# Patient Record
Sex: Female | Born: 1965
Health system: Southern US, Community
[De-identification: ages and names within clinical notes are randomized; demographics above are authoritative.]

## PROBLEM LIST (undated history)

## (undated) DIAGNOSIS — D126 Benign neoplasm of colon, unspecified: Secondary | ICD-10-CM

## (undated) DIAGNOSIS — F1010 Alcohol abuse, uncomplicated: Secondary | ICD-10-CM

## (undated) DIAGNOSIS — E785 Hyperlipidemia, unspecified: Secondary | ICD-10-CM

## (undated) DIAGNOSIS — M1990 Unspecified osteoarthritis, unspecified site: Secondary | ICD-10-CM

## (undated) DIAGNOSIS — I10 Essential (primary) hypertension: Secondary | ICD-10-CM

## (undated) DIAGNOSIS — K862 Cyst of pancreas: Secondary | ICD-10-CM

## (undated) DIAGNOSIS — M109 Gout, unspecified: Secondary | ICD-10-CM

## (undated) DIAGNOSIS — K5792 Diverticulitis of intestine, part unspecified, without perforation or abscess without bleeding: Secondary | ICD-10-CM

## (undated) HISTORY — DX: Diverticulitis of intestine, part unspecified, without perforation or abscess without bleeding: K57.92

## (undated) HISTORY — DX: Gout, unspecified: M10.9

## (undated) HISTORY — PX: TONSILLECTOMY: SUR1361

## (undated) HISTORY — DX: Hyperlipidemia, unspecified: E78.5

## (undated) HISTORY — DX: Unspecified osteoarthritis, unspecified site: M19.90

## (undated) HISTORY — DX: Alcohol abuse, uncomplicated: F10.10

## (undated) HISTORY — PX: WISDOM TOOTH EXTRACTION: SHX21

## (undated) HISTORY — DX: Cyst of pancreas: K86.2

## (undated) HISTORY — DX: Benign neoplasm of colon, unspecified: D12.6

## (undated) HISTORY — PX: MOUTH SURGERY: SHX715

---

## 2011-08-22 ENCOUNTER — Emergency Department (HOSPITAL_COMMUNITY): Payer: Worker's Compensation

## 2011-08-22 ENCOUNTER — Encounter (HOSPITAL_COMMUNITY): Payer: Self-pay

## 2011-08-22 ENCOUNTER — Emergency Department (HOSPITAL_COMMUNITY)
Admission: EM | Admit: 2011-08-22 | Discharge: 2011-08-22 | Disposition: A | Discharge status: Home / Self Care | Payer: Worker's Compensation | Attending: Emergency Medicine | Admitting: Emergency Medicine

## 2011-08-22 DIAGNOSIS — S139XXA Sprain of joints and ligaments of unspecified parts of neck, initial encounter: Secondary | ICD-10-CM | POA: Insufficient documentation

## 2011-08-22 DIAGNOSIS — R51 Headache: Secondary | ICD-10-CM | POA: Insufficient documentation

## 2011-08-22 DIAGNOSIS — S161XXA Strain of muscle, fascia and tendon at neck level, initial encounter: Secondary | ICD-10-CM

## 2011-08-22 DIAGNOSIS — M542 Cervicalgia: Secondary | ICD-10-CM | POA: Insufficient documentation

## 2011-08-22 DIAGNOSIS — I10 Essential (primary) hypertension: Secondary | ICD-10-CM | POA: Insufficient documentation

## 2011-08-22 DIAGNOSIS — S0990XA Unspecified injury of head, initial encounter: Secondary | ICD-10-CM | POA: Insufficient documentation

## 2011-08-22 DIAGNOSIS — M79609 Pain in unspecified limb: Secondary | ICD-10-CM | POA: Insufficient documentation

## 2011-08-22 HISTORY — DX: Essential (primary) hypertension: I10

## 2011-08-22 MED ORDER — HYDROCODONE-ACETAMINOPHEN 5-325 MG PO TABS: 1.0000 | ORAL_TABLET | Freq: Four times a day (QID) | ORAL | Status: AC | PRN

## 2011-08-22 MED ORDER — ONDANSETRON 4 MG PO TBDP
ORAL_TABLET | ORAL | Status: AC
  Administered 2011-08-22: 8 mg
  Filled 2011-08-22: qty 2

## 2011-08-22 MED ORDER — ONDANSETRON HCL 4 MG/2ML IJ SOLN: 4.0000 mg | Freq: Once | INTRAMUSCULAR | Status: DC

## 2011-08-22 MED ORDER — OXYCODONE-ACETAMINOPHEN 5-325 MG PO TABS
1.0000 | ORAL_TABLET | Freq: Once | ORAL | Status: AC
  Administered 2011-08-22: 1 via ORAL
  Filled 2011-08-22: qty 1

## 2011-08-22 MED ORDER — NAPROXEN 500 MG PO TABS: 500.0000 mg | ORAL_TABLET | Freq: Two times a day (BID) | ORAL | Status: AC

## 2011-08-22 MED ORDER — ONDANSETRON HCL 8 MG PO TABS: 8.0000 mg | ORAL_TABLET | Freq: Once | ORAL | Status: DC

## 2011-08-22 NOTE — ED Notes (Signed)
Pt cleared from back board per MD 

## 2011-08-22 NOTE — ED Notes (Signed)
Pt [employer]  [phone]

## 2011-08-22 NOTE — ED Notes (Signed)
Patient undressed after taken off LSB and on blood pressure cuff and pulse ox.

## 2011-08-22 NOTE — ED Notes (Signed)
Patient requesting something for pain. RN notified 
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
## 2011-08-22 NOTE — ED Notes (Signed)
MD at bedside. 
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
## 2012-01-20 ENCOUNTER — Other Ambulatory Visit: Payer: Self-pay | Admitting: Emergency Medicine

## 2012-01-20 NOTE — Telephone Encounter (Signed)
Need chart

## 2012-10-09 ENCOUNTER — Ambulatory Visit: Payer: Self-pay | Admitting: Emergency Medicine

## 2012-10-09 VITALS — BP 118/74 | HR 80 | Temp 98.4°F | Resp 18 | Ht 60.0 in | Wt 154.2 lb

## 2012-10-09 DIAGNOSIS — M47812 Spondylosis without myelopathy or radiculopathy, cervical region: Secondary | ICD-10-CM | POA: Insufficient documentation

## 2012-10-09 DIAGNOSIS — J301 Allergic rhinitis due to pollen: Secondary | ICD-10-CM

## 2012-10-09 DIAGNOSIS — J309 Allergic rhinitis, unspecified: Secondary | ICD-10-CM

## 2012-10-09 DIAGNOSIS — I10 Essential (primary) hypertension: Secondary | ICD-10-CM

## 2012-10-09 DIAGNOSIS — M542 Cervicalgia: Secondary | ICD-10-CM

## 2012-10-09 DIAGNOSIS — F1020 Alcohol dependence, uncomplicated: Secondary | ICD-10-CM | POA: Insufficient documentation

## 2012-10-09 LAB — CBC
Hemoglobin: 13.1 g/dL (ref 12.0–15.0)
MCH: 29.3 pg (ref 26.0–34.0)
Platelets: 371 10*3/uL (ref 150–400)
RBC: 4.47 MIL/uL (ref 3.87–5.11)
WBC: 12 10*3/uL — ABNORMAL HIGH (ref 4.0–10.5)

## 2012-10-09 LAB — COMPREHENSIVE METABOLIC PANEL
ALT: 12 U/L (ref 0–35)
Albumin: 4.6 g/dL (ref 3.5–5.2)
CO2: 26 mEq/L (ref 19–32)
Chloride: 102 mEq/L (ref 96–112)
Glucose, Bld: 86 mg/dL (ref 70–99)
Potassium: 4.5 mEq/L (ref 3.5–5.3)
Sodium: 138 mEq/L (ref 135–145)
Total Protein: 7.3 g/dL (ref 6.0–8.3)

## 2012-10-09 MED ORDER — LISINOPRIL-HYDROCHLOROTHIAZIDE 10-12.5 MG PO TABS: ORAL_TABLET | ORAL | Status: DC

## 2012-10-09 MED ORDER — CYCLOBENZAPRINE HCL 10 MG PO TABS: ORAL_TABLET | ORAL | Status: DC

## 2012-10-09 MED ORDER — FLUTICASONE PROPIONATE 50 MCG/ACT NA SUSP: 2.0000 | Freq: Every day | NASAL | Status: DC
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
## 2012-10-22 ENCOUNTER — Encounter: Payer: Self-pay | Admitting: Emergency Medicine

## 2013-04-20 ENCOUNTER — Ambulatory Visit: Payer: Self-pay | Admitting: Emergency Medicine

## 2013-04-20 VITALS — BP 108/78 | HR 91 | Temp 97.9°F | Resp 18 | Ht 59.75 in | Wt 140.8 lb

## 2013-04-20 DIAGNOSIS — R1032 Left lower quadrant pain: Secondary | ICD-10-CM

## 2013-04-20 DIAGNOSIS — R509 Fever, unspecified: Secondary | ICD-10-CM

## 2013-04-20 DIAGNOSIS — F17200 Nicotine dependence, unspecified, uncomplicated: Secondary | ICD-10-CM | POA: Insufficient documentation

## 2013-04-20 LAB — POCT CBC
Granulocyte percent: 77.7 %G (ref 37–80)
Hemoglobin: 13.9 g/dL (ref 12.2–16.2)
MCH, POC: 30.6 pg (ref 27–31.2)
MCV: 95.2 fL (ref 80–97)
MID (cbc): 0.5 (ref 0–0.9)
MPV: 8.9 fL (ref 0–99.8)
Platelet Count, POC: 304 10*3/uL (ref 142–424)
RBC: 4.54 M/uL (ref 4.04–5.48)
WBC: 8.2 10*3/uL (ref 4.6–10.2)

## 2013-04-20 LAB — COMPREHENSIVE METABOLIC PANEL
ALT: 20 U/L (ref 0–35)
AST: 24 U/L (ref 0–37)
Albumin: 4.3 g/dL (ref 3.5–5.2)
BUN: 4 mg/dL — ABNORMAL LOW (ref 6–23)
CO2: 28 mEq/L (ref 19–32)
Calcium: 9.6 mg/dL (ref 8.4–10.5)
Chloride: 101 mEq/L (ref 96–112)
Creat: 0.54 mg/dL (ref 0.50–1.10)
Potassium: 3.2 mEq/L — ABNORMAL LOW (ref 3.5–5.3)

## 2013-04-20 LAB — IFOBT (OCCULT BLOOD): IFOBT: NEGATIVE

## 2013-04-20 LAB — POCT URINALYSIS DIPSTICK
Leukocytes, UA: NEGATIVE
Nitrite, UA: NEGATIVE
Urobilinogen, UA: 0.2
pH, UA: 6

## 2013-04-20 LAB — POCT UA - MICROSCOPIC ONLY
Casts, Ur, LPF, POC: NEGATIVE
Crystals, Ur, HPF, POC: NEGATIVE
Yeast, UA: NEGATIVE

## 2013-04-20 MED ORDER — CIPROFLOXACIN HCL 500 MG PO TABS: 500.0000 mg | ORAL_TABLET | Freq: Two times a day (BID) | ORAL | Status: DC

## 2013-04-20 NOTE — Patient Instructions (Addendum)
Diverticulitis  A diverticulum is a small pouch or sac on the colon. Diverticulosis is the presence of these diverticula on the colon. Diverticulitis is the irritation (inflammation) or infection of diverticula.  CAUSES   The colon and its diverticula contain bacteria. If food particles block the tiny opening to a diverticulum, the bacteria inside can grow and cause an increase in pressure. This leads to infection and inflammation and is called diverticulitis.  SYMPTOMS    Abdominal pain and tenderness. Usually, the pain is located on the left side of your abdomen. However, it could be located elsewhere.   Fever.   Bloating.   Feeling sick to your stomach (nausea).   Throwing up (vomiting).   Abnormal stools.  DIAGNOSIS   Your caregiver will take a history and perform a physical exam. Since many things can cause abdominal pain, other tests may be necessary. Tests may include:   Blood tests.   Urine tests.   X-ray of the abdomen.   CT scan of the abdomen.  Sometimes, surgery is needed to determine if diverticulitis or other conditions are causing your symptoms.  TREATMENT   Most of the time, you can be treated without surgery. Treatment includes:   Resting the bowels by only having liquids for a few days. As you improve, you will need to eat a low-fiber diet.   Intravenous (IV) fluids if you are losing body fluids (dehydrated).   Antibiotic medicines that treat infections may be given.   Pain and nausea medicine, if needed.   Surgery if the inflamed diverticulum has burst.  HOME CARE INSTRUCTIONS    Try a clear liquid diet (broth, tea, or water for as long as directed by your caregiver). You may then gradually begin a low-fiber diet as tolerated.   A low-fiber diet is a diet with less than 10 grams of fiber. Choose the foods below to reduce fiber in the diet:   White breads, cereals, rice, and pasta.   Cooked fruits and vegetables or soft fresh fruits and vegetables without the skin.   Ground or  well-cooked tender beef, ham, veal, lamb, pork, or poultry.   Eggs and seafood.   After your diverticulitis symptoms have improved, your caregiver may put you on a high-fiber diet. A high-fiber diet includes 14 grams of fiber for every 1000 calories consumed. For a standard 2000 calorie diet, you would need 28 grams of fiber. Follow these diet guidelines to help you increase the fiber in your diet. It is important to slowly increase the amount fiber in your diet to avoid gas, constipation, and bloating.   Choose whole-grain breads, cereals, pasta, and brown rice.   Choose fresh fruits and vegetables with the skin on. Do not overcook vegetables because the more vegetables are cooked, the more fiber is lost.   Choose more nuts, seeds, legumes, dried peas, beans, and lentils.   Look for food products that have greater than 3 grams of fiber per serving on the Nutrition Facts label.   Take all medicine as directed by your caregiver.   If your caregiver has given you a follow-up appointment, it is very important that you go. Not going could result in lasting (chronic) or permanent injury, pain, and disability. If there is any problem keeping the appointment, call to reschedule.  SEEK MEDICAL CARE IF:    Your pain does not improve.   You have a hard time advancing your diet beyond clear liquids.   Your bowel movements do not return   to normal.  SEEK IMMEDIATE MEDICAL CARE IF:    Your pain becomes worse.   You have an oral temperature above 102 F (38.9 C), not controlled by medicine.   You have repeated vomiting.   You have bloody or black, tarry stools.   Symptoms that brought you to your caregiver become worse or are not getting better.  MAKE SURE YOU:    Understand these instructions.   Will watch your condition.   Will get help right away if you are not doing well or get worse.  Document Released: 04/11/2005 Document Revised: 09/24/2011 Document Reviewed: 08/07/2010  [employer] Patient Information  2014 [employer]  Hematuria, Adult  Hematuria (blood in your urine) can be caused by a bladder infection (cystitis), kidney infection (pyelonephritis), prostate infection (prostatitis), or kidney stone. Infections will usually respond to antibiotics (medications which kill germs), and a kidney stone will usually pass through your urine without further treatment. If you were put on antibiotics, take all the medicine until gone. You may feel better in a few days, but take all of your medicine or the infection may not respond and become more difficult to treat. If antibiotics were not given, an infection did not cause the blood in the urine. A further work up to find out the reason may be needed.  HOME CARE INSTRUCTIONS    Drink lots of fluid, 3 to 4 quarts a day. If you have been diagnosed with an infection, cranberry juice is especially recommended, in addition to large amounts of water.   Avoid caffeine, tea, and carbonated beverages, because they tend to irritate the bladder.   Avoid alcohol as it may irritate the prostate.   Only take over-the-counter or prescription medicines for pain, discomfort, or fever as directed by your caregiver.   If you have been diagnosed with a kidney stone follow your caregivers instructions regarding straining your urine to catch the stone.  TO PREVENT FURTHER INFECTIONS:   Empty the bladder often. Avoid holding urine for long periods of time.   After a bowel movement, women should cleanse front to back. Use each tissue only once.   Empty the bladder before and after sexual intercourse if you are a female.   Return to your caregiver if you develop back pain, fever, nausea (feeling sick to your stomach), vomiting, or your symptoms (problems) are not better in 3 days. Return sooner if you are getting worse.  If you have been requested to return for further testing make sure to keep your appointments. If an infection is not the cause of blood in your urine, X-rays may be  required. Your caregiver will discuss this with you.  SEEK IMMEDIATE MEDICAL CARE IF:    You have a persistent fever over 102 F (38.9 C).   You develop severe vomiting and are unable to keep the medication down.   You develop severe back or abdominal pain despite taking your medications.   You begin passing a large amount of blood or clots in your urine.   You feel extremely weak or faint, or pass out.  MAKE SURE YOU:    Understand these instructions.   Will watch your condition.   Will get help right away if you are not doing well or get worse.  Document Released: 07/02/2005 Document Revised: 09/24/2011 Document Reviewed: 02/19/2008  [employer] Patient Information 2014 [employer]

## 2013-04-20 NOTE — Progress Notes (Addendum)
Subjective:    Patient ID: Ruth Perez, female    DOB: Apr 11, 1966, 47 y.o.   MRN: [id number]  Diarrhea  Associated symptoms include abdominal pain (cramping). Pertinent negatives include no chills, coughing, fever or vomiting.  Fever  Associated symptoms include abdominal pain (cramping) and diarrhea. Pertinent negatives include no congestion, coughing, nausea or vomiting.  Abdominal Pain Associated symptoms include diarrhea. Pertinent negatives include no fever, nausea or vomiting.   HPI Comments: Ruth Perez is a 47 y.o. female who presents to the Emergency Department complaining of constant, gradually worsening diarrhea and fever (max 103) which began 2 days ago. She states abdominal cramping which caused her to leave work on Friday and since onset of her sx has been drinking both water and Gatorade and eating chicken noodle soup. She has been taking ibuprofen and tylenol with temporary relief from fever. She states every few hours has an episode of diarrhea. She states green colored stool and mucous consistency. She denies cough, congestion. She denies previous similar episodes. She states fever improved yesterday but is still having frequent bouts of diarrhea.    Past Medical History  Diagnosis Date  . Hypertension    Current Outpatient Prescriptions on File Prior to Visit  Medication Sig Dispense Refill  . lisinopril-hydrochlorothiazide (PRINZIDE,ZESTORETIC) 10-12.5 MG per tablet TAKE ONE TABLET BY MOUTH EVERY DAY FOR BLOOD PRESSURE  30 tablet  11  . cyclobenzaprine (FLEXERIL) 10 MG tablet Take a half tablet during the day if needed and one tablet at night.  30 tablet  5  . fluticasone (FLONASE) 50 MCG/ACT nasal spray Place 2 sprays into the nose daily.  16 g  6   No current facility-administered medications on file prior to visit.     Review of Systems  Constitutional: Negative for fever and chills.  HENT: Negative for congestion and rhinorrhea.   Respiratory:  Negative for cough and shortness of breath.   Gastrointestinal: Positive for abdominal pain (cramping) and diarrhea. Negative for nausea and vomiting.  Neurological: Negative for weakness.  All other systems reviewed and are negative.       Objective:   Physical Exam  Nursing note and vitals reviewed. Constitutional: She is oriented to person, place, and time. She appears well-developed and well-nourished. No distress.  HENT:  Head: Normocephalic and atraumatic.  Eyes: EOM are normal.  Neck: No tracheal deviation present.  Cardiovascular: Normal rate.   Pulmonary/Chest: Effort normal. No respiratory distress. She has wheezes (rhochi and wheezes on right).  Abdominal: Soft. Bowel sounds are normal. She exhibits no distension. There is tenderness (LLQ abdominal tenderness).  Musculoskeletal: Normal range of motion.  Neurological: She is alert and oriented to person, place, and time.  Skin: Skin is warm and dry.  Psychiatric: She has a normal mood and affect. Her behavior is normal.   Results for orders placed in visit on 04/20/13  IFOBT (OCCULT BLOOD)      Result Value Range   IFOBT Negative    POCT CBC      Result Value Range   WBC 8.2  4.6 - 10.2 K/uL   Lymph, poc 1.3  0.6 - 3.4   POC LYMPH PERCENT 16.2  10 - 50 %L   MID (cbc) 0.5  0 - 0.9   POC MID % 6.1  0 - 12 %M   POC Granulocyte 6.4  2 - 6.9   Granulocyte percent 77.7  37 - 80 %G   RBC 4.54  4.04 - 5.48 M/uL  Hemoglobin 13.9  12.2 - 16.2 g/dL   HCT, POC 13.2  44.0 - 47.9 %   MCV 95.2  80 - 97 fL   MCH, POC 30.6  27 - 31.2 pg   MCHC 32.2  31.8 - 35.4 g/dL   RDW, POC 10.2     Platelet Count, POC 304  142 - 424 K/uL   MPV 8.9  0 - 99.8 fL  POCT URINALYSIS DIPSTICK      Result Value Range   Color, UA yellow     Clarity, UA clear     Glucose, UA neg     Bilirubin, UA neg     Ketones, UA neg     Spec Grav, UA 1.020     Blood, UA large     pH, UA 6.0     Protein, UA 30     Urobilinogen, UA 0.2     Nitrite, UA  neg     Leukocytes, UA Negative    POCT UA - MICROSCOPIC ONLY      Result Value Range   WBC, Ur, HPF, POC neg     RBC, urine, microscopic 10-15     Bacteria, U Microscopic trace     Mucus, UA positive     Epithelial cells, urine per micros 2-5     Crystals, Ur, HPF, POC neg     Casts, Ur, LPF, POC neg     Yeast, UA neg           Assessment & Plan:  She does have significant tenderness in her left lower quadrant. With her fever mucousy stools I suspect diverticulitis would be most likely. Patient will use a strainer because of the hematuria. I placed her on Cipro for 5 days. She is to see me on Thursday reevaluation at that time. I did not do a CT abdomen and pelvis are make a urological referral at the present time .

## 2013-04-21 ENCOUNTER — Other Ambulatory Visit: Payer: Self-pay

## 2013-04-21 MED ORDER — POTASSIUM CHLORIDE CRYS ER 20 MEQ PO TBCR: 20.0000 meq | EXTENDED_RELEASE_TABLET | Freq: Every day | ORAL | Status: DC

## 2013-04-23 ENCOUNTER — Ambulatory Visit: Payer: Self-pay | Admitting: Emergency Medicine

## 2013-04-23 VITALS — BP 110/76 | HR 74 | Temp 98.0°F | Resp 18 | Ht 59.75 in | Wt 142.2 lb

## 2013-04-23 DIAGNOSIS — R109 Unspecified abdominal pain: Secondary | ICD-10-CM

## 2013-04-23 DIAGNOSIS — N898 Other specified noninflammatory disorders of vagina: Secondary | ICD-10-CM

## 2013-04-23 DIAGNOSIS — R319 Hematuria, unspecified: Secondary | ICD-10-CM

## 2013-04-23 LAB — POCT UA - MICROSCOPIC ONLY
WBC, Ur, HPF, POC: NEGATIVE
Yeast, UA: NEGATIVE

## 2013-04-23 LAB — POCT URINALYSIS DIPSTICK
Glucose, UA: NEGATIVE
Leukocytes, UA: NEGATIVE
Nitrite, UA: NEGATIVE
Protein, UA: NEGATIVE
Urobilinogen, UA: 0.2

## 2013-04-23 LAB — POCT WET PREP WITH KOH
KOH Prep POC: NEGATIVE
Trichomonas, UA: NEGATIVE
Yeast Wet Prep HPF POC: NEGATIVE
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
## 2013-10-31 ENCOUNTER — Other Ambulatory Visit: Payer: Self-pay | Admitting: Emergency Medicine

## 2013-10-31 NOTE — Telephone Encounter (Signed)
Needs office visit.

## 2013-11-29 ENCOUNTER — Other Ambulatory Visit: Payer: Self-pay | Admitting: Physician Assistant

## 2013-12-14 ENCOUNTER — Telehealth: Payer: Self-pay

## 2013-12-14 MED ORDER — LISINOPRIL-HYDROCHLOROTHIAZIDE 10-12.5 MG PO TABS: 1.0000 | ORAL_TABLET | Freq: Every day | ORAL | Status: DC

## 2013-12-14 NOTE — Telephone Encounter (Signed)
Pt called and would like to know if she can have her script for Lisinopril called into [facility].. She can be reached at [phone] or [phone]. Thank you

## 2013-12-14 NOTE — Telephone Encounter (Signed)
Pt just got insurance. Wants to know if we can send in 30 more tablets to give her time to see Dr. Everlene Farrier. Sent. Pt aware that no more will be RF'ed after this  Scheduling. Can we see if Dr. Everlene Farrier has any openings in June for an OV? If not can you please call and let pt know so she can just walk in. Thanks

## 2013-12-15 NOTE — Telephone Encounter (Signed)
LMVM to CB to schedule an appt with Dr. Everlene Farrier.

## 2014-01-25 ENCOUNTER — Telehealth: Payer: Self-pay

## 2014-01-25 NOTE — Telephone Encounter (Signed)
Pt called and states she needs a refill on her Lisinopril. She will be making an appt. She can be reached @ [phone]. Thank you

## 2014-01-26 MED ORDER — LISINOPRIL-HYDROCHLOROTHIAZIDE 10-12.5 MG PO TABS: 1.0000 | ORAL_TABLET | Freq: Every day | ORAL | Status: DC

## 2014-01-26 NOTE — Telephone Encounter (Signed)
Sent in a 30 day supply of lisinopril for pt- called pt and lm- last time we will be able to fill request until she makes an appt. Or RTC

## 2014-02-26 ENCOUNTER — Ambulatory Visit: Payer: Self-pay | Admitting: Emergency Medicine

## 2014-03-01 ENCOUNTER — Ambulatory Visit (INDEPENDENT_AMBULATORY_CARE_PROVIDER_SITE_OTHER): Payer: BC Managed Care – PPO

## 2014-03-01 ENCOUNTER — Ambulatory Visit (INDEPENDENT_AMBULATORY_CARE_PROVIDER_SITE_OTHER): Payer: BC Managed Care – PPO | Admitting: Emergency Medicine

## 2014-03-01 ENCOUNTER — Encounter: Payer: Self-pay | Admitting: Emergency Medicine

## 2014-03-01 VITALS — BP 148/80 | HR 98 | Temp 98.0°F | Resp 16 | Wt 136.0 lb

## 2014-03-01 DIAGNOSIS — L293 Anogenital pruritus, unspecified: Secondary | ICD-10-CM

## 2014-03-01 DIAGNOSIS — I10 Essential (primary) hypertension: Secondary | ICD-10-CM

## 2014-03-01 DIAGNOSIS — N898 Other specified noninflammatory disorders of vagina: Secondary | ICD-10-CM

## 2014-03-01 DIAGNOSIS — R61 Generalized hyperhidrosis: Secondary | ICD-10-CM

## 2014-03-01 DIAGNOSIS — N951 Menopausal and female climacteric states: Secondary | ICD-10-CM

## 2014-03-01 DIAGNOSIS — F17200 Nicotine dependence, unspecified, uncomplicated: Secondary | ICD-10-CM

## 2014-03-01 DIAGNOSIS — R319 Hematuria, unspecified: Secondary | ICD-10-CM | POA: Insufficient documentation

## 2014-03-01 DIAGNOSIS — Z1211 Encounter for screening for malignant neoplasm of colon: Secondary | ICD-10-CM

## 2014-03-01 DIAGNOSIS — Z780 Asymptomatic menopausal state: Secondary | ICD-10-CM

## 2014-03-01 LAB — POCT URINALYSIS DIPSTICK
Bilirubin, UA: NEGATIVE
Glucose, UA: NEGATIVE
KETONES UA: NEGATIVE
LEUKOCYTES UA: NEGATIVE
NITRITE UA: NEGATIVE
PH UA: 5.5
PROTEIN UA: NEGATIVE
Spec Grav, UA: 1.02
Urobilinogen, UA: 0.2

## 2014-03-01 LAB — CBC WITH DIFFERENTIAL/PLATELET
BASOS PCT: 0 % (ref 0–1)
Basophils Absolute: 0 10*3/uL (ref 0.0–0.1)
Eosinophils Absolute: 0.2 10*3/uL (ref 0.0–0.7)
Eosinophils Relative: 2 % (ref 0–5)
HEMATOCRIT: 41.5 % (ref 36.0–46.0)
HEMOGLOBIN: 14 g/dL (ref 12.0–15.0)
LYMPHS ABS: 2.1 10*3/uL (ref 0.7–4.0)
LYMPHS PCT: 21 % (ref 12–46)
MCH: 31.5 pg (ref 26.0–34.0)
MCHC: 33.7 g/dL (ref 30.0–36.0)
MCV: 93.3 fL (ref 78.0–100.0)
MONO ABS: 0.7 10*3/uL (ref 0.1–1.0)
MONOS PCT: 7 % (ref 3–12)
NEUTROS ABS: 6.9 10*3/uL (ref 1.7–7.7)
NEUTROS PCT: 70 % (ref 43–77)
Platelets: 348 10*3/uL (ref 150–400)
RBC: 4.45 MIL/uL (ref 3.87–5.11)
RDW: 14.2 % (ref 11.5–15.5)
WBC: 9.9 10*3/uL (ref 4.0–10.5)

## 2014-03-01 LAB — COMPLETE METABOLIC PANEL WITH GFR
ALBUMIN: 4.5 g/dL (ref 3.5–5.2)
ALK PHOS: 51 U/L (ref 39–117)
ALT: 27 U/L (ref 0–35)
AST: 32 U/L (ref 0–37)
BUN: 16 mg/dL (ref 6–23)
CALCIUM: 9.2 mg/dL (ref 8.4–10.5)
CHLORIDE: 105 meq/L (ref 96–112)
CO2: 25 meq/L (ref 19–32)
Creat: 0.71 mg/dL (ref 0.50–1.10)
GFR, Est African American: >89 mL/min
GLUCOSE: 93 mg/dL (ref 70–99)
POTASSIUM: 4.8 meq/L (ref 3.5–5.3)
SODIUM: 140 meq/L (ref 135–145)
TOTAL PROTEIN: 7.3 g/dL (ref 6.0–8.3)
Total Bilirubin: 0.3 mg/dL (ref 0.2–1.2)

## 2014-03-01 LAB — POCT WET PREP WITH KOH
KOH PREP POC: NEGATIVE
TRICHOMONAS UA: NEGATIVE
WBC WET PREP PER HPF POC: NEGATIVE
YEAST WET PREP PER HPF POC: NEGATIVE

## 2014-03-01 LAB — LIPID PANEL
CHOL/HDL RATIO: 3.4 ratio
CHOLESTEROL: 238 mg/dL — AB (ref 0–200)
HDL: 71 mg/dL (ref >39)
LDL Cholesterol: 109 mg/dL — ABNORMAL HIGH (ref 0–99)
Triglycerides: 290 mg/dL — ABNORMAL HIGH (ref <150)
VLDL: 58 mg/dL — ABNORMAL HIGH (ref 0–40)

## 2014-03-01 LAB — IFOBT (OCCULT BLOOD): IMMUNOLOGICAL FECAL OCCULT BLOOD TEST: NEGATIVE

## 2014-03-01 LAB — POCT UA - MICROSCOPIC ONLY
CRYSTALS, UR, HPF, POC: NEGATIVE
Casts, Ur, LPF, POC: NEGATIVE
Mucus, UA: NEGATIVE
YEAST UA: NEGATIVE

## 2014-03-01 MED ORDER — LISINOPRIL-HYDROCHLOROTHIAZIDE 10-12.5 MG PO TABS: 1.0000 | ORAL_TABLET | Freq: Every day | ORAL | Status: DC

## 2014-03-01 MED ORDER — CLOTRIMAZOLE-BETAMETHASONE 1-0.05 % EX CREA: 1.0000 "application " | TOPICAL_CREAM | Freq: Two times a day (BID) | CUTANEOUS | Status: DC

## 2014-03-01 NOTE — Progress Notes (Signed)
Subjective:    Patient ID: IVI GRIFFITH, female    DOB: 01-03-1966, 48 y.o.   MRN: [id number]  HPI  Patient here doing all right.  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  Smoking a pack a day and drinking six or seven per day.  Concerned about lab work last time she was here.  Blood in urine last time.  Potassium pills flares up her hemorrhoids.  Menopause is causing her sweats to be worse but now it is making her feel sick.  It is getting worse.  Has not had a period since last September.  Has never seen blood in her urine.  Still having vaginal itching and pain in the lower left side.  Patient is not sexually active.      Review of Systems     Objective:   Physical Exam patient is alert and cooperative. She does have excessive hair. Neck is supple without adenopathy chest is clear breasts without masses heart regular rate no murmurs abdomen is flat liver spleen not enlarged GU 7 normal female cervix is visualized and Pap smear done. The uterus is retroflexed. Rectal reveals no masses. There is some redness of the vaginal vault  [facility] reading (PRIMARY) by  Dr. Everlene Farrier on the PA view there is some mild prominence of the right infrahilar area however on the lateral view no abnormalities are seen there are no definite abnormalities on these films.  Results for orders placed in visit on 03/01/14  POCT UA - MICROSCOPIC ONLY      Result Value Ref Range   WBC, Ur, HPF, POC 0-1     RBC, urine, microscopic 4-7     Bacteria, U Microscopic 1+     Mucus, UA neg     Epithelial cells, urine per micros 1-2     Crystals, Ur, HPF, POC neg     Casts, Ur, LPF, POC neg     Yeast, UA neg    POCT URINALYSIS DIPSTICK      Result Value Ref Range   Color, UA yellow     Clarity, UA clear     Glucose, UA neg     Bilirubin, UA neg     Ketones, UA neg     Spec Grav, UA 1.020     Blood, UA mod     pH, UA 5.5     Protein, UA neg     Urobilinogen, UA 0.2     Nitrite, UA neg       Leukocytes, UA Negative    POCT WET PREP WITH KOH      Result Value Ref Range   Trichomonas, UA Negative     Clue Cells Wet Prep HPF POC 0-1     Epithelial Wet Prep HPF POC 0-1     Yeast Wet Prep HPF POC neg     Bacteria Wet Prep HPF POC 1+     RBC Wet Prep HPF POC 6-8     WBC Wet Prep HPF POC neg     KOH Prep POC Negative    IFOBT (OCCULT BLOOD)      Result Value Ref Range   IFOBT Negative     Meds ordered this encounter  Medications  . lisinopril-hydrochlorothiazide (PRINZIDE,ZESTORETIC) 10-12.5 MG per tablet    Sig: Take 1 tablet by mouth daily.    Dispense:  90 tablet    Refill:  3       Assessment &  Plan:  Patient has hematuria is a heavy smoker at risk for bladder cancer referral made to urology further evaluation. Referral made for colonoscopy. Lotrisone cream for vaginal itching. Pressure medication refilled routine labs were done. Patient is not a candidate for hormonal therapy for menopausal symptoms.

## 2014-03-02 LAB — PAP IG W/ RFLX HPV ASCU

## 2014-05-03 ENCOUNTER — Encounter: Payer: Self-pay | Admitting: Gastroenterology

## 2014-05-03 ENCOUNTER — Ambulatory Visit (INDEPENDENT_AMBULATORY_CARE_PROVIDER_SITE_OTHER): Payer: BC Managed Care – PPO | Admitting: Gastroenterology

## 2014-05-03 VITALS — BP 120/70 | HR 84 | Ht 60.0 in | Wt 141.0 lb

## 2014-05-03 DIAGNOSIS — R197 Diarrhea, unspecified: Secondary | ICD-10-CM

## 2014-05-03 DIAGNOSIS — R194 Change in bowel habit: Secondary | ICD-10-CM

## 2014-05-03 MED ORDER — MOVIPREP 100 G PO SOLR: 1.0000 | Freq: Once | ORAL | Status: DC

## 2014-05-03 NOTE — Patient Instructions (Addendum)
One of your biggest health concerns is your smoking.  This increases your risk for most cancers and serious cardiovascular diseases such as strokes, heart attacks.  You should try your best to stop.  If you need assistance, please contact your PCP or Smoking Cessation Class at [facility] [phone]) or [facility] (1-800-QUIT-NOW). You will be set up for a colonoscopy changes in bowels, diarrhea. You probably drink too much alcohol and should try to cut back as best that you can or stop altogether.

## 2014-05-03 NOTE — Progress Notes (Signed)
HPI: This is a   very pleasant 48 year old woman whom I am meeting for the first time today.  A long time ago, 10 years ago she had "some stomach stuff" and she had barium enema and that showed diverticulosis.  She had fever, diarrhea a year ago.  Was put on antibiotics.  Alcohol abuser, overuser.   Smokes 1ppd  Change in her bowels for past few months. Real loose stools.  Indigestion. NEver sees blood in her stool.  Overall her weight has been increasing.  Intermttent diarrrhea, even nocturnal.  Has never had colonoscopy  Drinks beer, usually 6-8 per day.1-2 cases of beer in a week.  Intermittent constipation.    Review of systems: Pertinent positive and negative review of systems were noted in the above HPI section. Complete review of systems was performed and was otherwise normal.    Past Medical History  Diagnosis Date  . Hypertension   . Hyperlipidemia   . Arthritis   . Alcohol abuse     Past Surgical History  Procedure Laterality Date  . Tonsillectomy    . Mouth surgery      Current Outpatient Prescriptions  Medication Sig Dispense Refill  . clotrimazole-betamethasone (LOTRISONE) cream Apply 1 application topically 2 (two) times daily.  45 g  1  . lisinopril-hydrochlorothiazide (PRINZIDE,ZESTORETIC) 10-12.5 MG per tablet Take 1 tablet by mouth daily.  90 tablet  3  . cyclobenzaprine (FLEXERIL) 10 MG tablet Take a half tablet during the day if needed and one tablet at night.  30 tablet  5  . fluticasone (FLONASE) 50 MCG/ACT nasal spray Place 2 sprays into the nose daily.  16 g  6   No current facility-administered medications for this visit.    Allergies as of 05/03/2014 - Review Complete 05/03/2014  Allergen Reaction Noted  . Codeine Nausea And Vomiting 08/22/2011    Family History  Problem Relation Age of Onset  . Heart disease Mother   . Kidney disease Father   . Colon polyps Father   . Heart disease Father   . Aneurysm Father     AAA  .  Neuropathy Father   . Thyroid cancer Mother     History   Social History  . Marital Status: Unknown    Spouse Name: N/A    Number of Children: 0  . Years of Education: N/A   Occupational History  . Not on file.   Social History Main Topics  . Smoking status: Current Every Day Smoker -- 1.00 packs/day for 35 years    Types: Cigarettes  . Smokeless tobacco: Never Used  . Alcohol Use: 24.5 oz/week    49 drink(s) per week  . Drug Use: Yes    Special: Marijuana  . Sexual Activity: Not on file   Other Topics Concern  . Not on file   Social History Narrative  . No narrative on file       Physical Exam: BP 120/70  Pulse 84  Ht 5' (1.524 m)  Wt 141 lb (63.957 kg)  BMI 27.54 kg/m2 Constitutional: generally well-appearing Psychiatric: alert and oriented x3 Eyes: extraocular movements intact Mouth: oral pharynx moist, no lesions Neck: supple no lymphadenopathy Cardiovascular: heart regular rate and rhythm Lungs: clear to auscultation bilaterally Abdomen: soft, nontender, nondistended, no obvious ascites, no peritoneal signs, normal bowel sounds Extremities: no lower extremity edema bilaterally Skin: no lesions on visible extremities    Assessment and plan: 48 y.o. female with  likely alcohol is on, change in her  bowel habits, chronically loose stools, lower pole discomforts  I explained to her that 6-8 beers per day can definitely contribute to bowel abnormalities and I recommended she try cut back or quit altogether as best as she can. She smokes cigarettes and recommended she stop that as well she can. She has never had colon cancer screening and we'll proceed with colonoscopy for her change in her bowel habits.

## 2014-05-10 ENCOUNTER — Encounter: Payer: Self-pay | Admitting: Gastroenterology

## 2014-06-15 HISTORY — PX: COLONOSCOPY: SHX174

## 2014-06-18 ENCOUNTER — Ambulatory Visit (AMBULATORY_SURGERY_CENTER): Payer: BC Managed Care – PPO | Admitting: Gastroenterology

## 2014-06-18 ENCOUNTER — Encounter: Payer: Self-pay | Admitting: Gastroenterology

## 2014-06-18 VITALS — BP 125/68 | HR 67 | Temp 98.4°F | Resp 18 | Ht 60.0 in | Wt 141.0 lb

## 2014-06-18 DIAGNOSIS — D125 Benign neoplasm of sigmoid colon: Secondary | ICD-10-CM

## 2014-06-18 DIAGNOSIS — D122 Benign neoplasm of ascending colon: Secondary | ICD-10-CM

## 2014-06-18 DIAGNOSIS — R194 Change in bowel habit: Secondary | ICD-10-CM

## 2014-06-18 DIAGNOSIS — D128 Benign neoplasm of rectum: Secondary | ICD-10-CM

## 2014-06-18 DIAGNOSIS — R197 Diarrhea, unspecified: Secondary | ICD-10-CM

## 2014-06-18 DIAGNOSIS — K621 Rectal polyp: Secondary | ICD-10-CM

## 2014-06-18 MED ORDER — SODIUM CHLORIDE 0.9 % IV SOLN: 500.0000 mL | INTRAVENOUS | Status: DC

## 2014-06-18 NOTE — Patient Instructions (Addendum)
One of your biggest health concerns is your smoking.  This increases your risk for most cancers and serious cardiovascular diseases such as strokes, heart attacks.  You should try your best to stop.  If you need assistance, please contact your PCP or Smoking Cessation Class at [facility] [phone]) or [place] (1-800-QUIT-NOW).    YOU HAD AN ENDOSCOPIC PROCEDURE TODAY AT [facility]: Refer to the procedure report that was given to you for any specific questions about what was found during the examination.  If the procedure report does not answer your questions, please call your gastroenterologist to clarify.  If you requested that your care partner not be given the details of your procedure findings, then the procedure report has been included in a sealed envelope for you to review at your convenience later.  YOU SHOULD EXPECT: Some feelings of bloating in the abdomen. Passage of more gas than usual.  Walking can help get rid of the air that was put into your GI tract during the procedure and reduce the bloating. If you had a lower endoscopy (such as a colonoscopy or flexible sigmoidoscopy) you may notice spotting of blood in your stool or on the toilet paper. If you underwent a bowel prep for your procedure, then you may not have a normal bowel movement for a few days.  DIET: Your first meal following the procedure should be a light meal and then it is ok to progress to your normal diet.  A half-sandwich or bowl of soup is an example of a good first meal.  Heavy or fried foods are harder to digest and may make you feel nauseous or bloated.  Likewise meals heavy in dairy and vegetables can cause extra gas to form and this can also increase the bloating.  Drink plenty of fluids but you should avoid alcoholic beverages for 24 hours.  ACTIVITY: Your care partner should take you home directly after the procedure.  You should plan to take it easy, moving slowly for the rest  of the day.  You can resume normal activity the day after the procedure however you should NOT DRIVE or use heavy machinery for 24 hours (because of the sedation medicines used during the test).    SYMPTOMS TO REPORT IMMEDIATELY: A gastroenterologist can be reached at any hour.  During normal business hours, 8:30 AM to 5:00 PM Monday through Friday, call [phone].  After hours and on weekends, please call the GI answering service at [phone] who will take a message and have the physician on call contact you.   Following lower endoscopy (colonoscopy or flexible sigmoidoscopy):  Excessive amounts of blood in the stool  Significant tenderness or worsening of abdominal pains  Swelling of the abdomen that is new, acute  Fever of 100F or higher  FOLLOW UP: If any biopsies were taken you will be contacted by phone or by letter within the next 1-3 weeks.  Call your gastroenterologist if you have not heard about the biopsies in 3 weeks.  Our staff will call the home number listed on your records the next business day following your procedure to check on you and address any questions or concerns that you may have at that time regarding the information given to you following your procedure. This is a courtesy call and so if there is no answer at the home number and we have not heard from you through the emergency physician on call, we will assume that you have  returned to your regular daily activities without incident.  SIGNATURES/CONFIDENTIALITY: You and/or your care partner have signed paperwork which will be entered into your electronic medical record.  These signatures attest to the fact that that the information above on your After Visit Summary has been reviewed and is understood.  Full responsibility of the confidentiality of this discharge information lies with you and/or your care-partner.    Handout was given to your care partner on polyps. You may resume your current medications  today. Await biopsy results. Please call if any questions or concerns.

## 2014-06-18 NOTE — Progress Notes (Signed)
No problems noted in the recovery room. maw 

## 2014-06-18 NOTE — Progress Notes (Signed)
Called to room to assist during endoscopic procedure.  Patient ID and intended procedure confirmed with present staff. Received instructions for my participation in the procedure from the performing physician.  

## 2014-06-18 NOTE — Progress Notes (Signed)
Report to PACU, RN, vss, BBS= Clear.  

## 2014-06-21 ENCOUNTER — Telehealth: Payer: Self-pay | Admitting: *Deleted

## 2014-06-21 NOTE — Telephone Encounter (Signed)
  Follow up Call-  Call back number 06/18/2014  Post procedure Call Back phone  # [phone]  Permission to leave phone message Yes     Patient questions:  Do you have a fever, pain , or abdominal swelling? No. Pain Score  0 *  Have you tolerated food without any problems? Yes.    Have you been able to return to your normal activities? Yes.    Do you have any questions about your discharge instructions: Diet   No. Medications  No. Follow up visit  No.  Do you have questions or concerns about your Care? No.  Actions: * If pain score is 4 or above: No action needed, pain <4.

## 2014-06-23 NOTE — Op Note (Addendum)
[facility]  [address] [place], [address]   COLONOSCOPY PROCEDURE REPORT  PATIENT: Ruth, Perez  MR#: [id number] BIRTHDATE: 03/21/1966 , 48  yrs. old GENDER: female ENDOSCOPIST: Milus Banister, MD REFERRED [id number] Everlene Farrier, M.D. PROCEDURE DATE:  06/18/2014 PROCEDURE:   Colonoscopy with hot biopsy and Colonoscopy with snare polypectomy First Screening Colonoscopy - Avg.  risk and is 50 yrs.  old or older - No.  Prior Negative Screening - Now for repeat screening. N/A  History of Adenoma - Now for follow-up colonoscopy & has been > or = to 3 yrs.  N/A  Polyps Removed Today? Yes. ASA CLASS:   Class II INDICATIONS:loose stools. MEDICATIONS: Propofol 400 mg IV and Monitored anesthesia care  DESCRIPTION OF PROCEDURE:   After the risks benefits and alternatives of the procedure were thoroughly explained, informed consent was obtained.  The digital rectal exam revealed no abnormalities of the rectum.   The [id number]  endoscope was introduced through the anus and advanced to the cecum, which was identified by both the appendix and ileocecal valve. No adverse events experienced.   The quality of the prep was excellent.  The instrument was then slowly withdrawn as the colon was fully examined.  Images were taken but not saved due to system wide network issues    COLON FINDINGS: Several polyps were found, removed and sent to pathology.  In the ascending colon a 5 mm polyp was removed with cold snare, it was sessile.  In the sigmoid colon a 4 mm polyp was removed with cold snare, it was sessile.  These were both put into pathology jar #1.  In the ascending colon and 11 mm sessile polyp was removed with hot snare and sent to pathology, this was into pathology jar #2.  Random colon biopsies were taken from throughout the colon and these were sent in pathology jar #3.  In the sigmoid colon a 1 cm sessile polyp was removed with hot snare and sent  to pathology, this was jar #4.  The examination was otherwise normal. Retroflexed views revealed no abnormalities. The time to cecum=3 minutes 16 seconds.  Withdrawal time=15 minutes 13 seconds.  The scope was withdrawn and the procedure completed. COMPLICATIONS: There were no immediate complications.  ENDOSCOPIC IMPRESSION: Several polyps were found, removed and sent to pathology.  In the ascending colon a 5 mm polyp was removed with cold snare, it was sessile.  In the sigmoid colon a 4 mm polyp was removed with cold snare, it was sessile.  These were both put into pathology jar #1. In the ascending colon and 11 mm sessile polyp was removed with hot snare and sent to pathology, this was into pathology jar #2. Random colon biopsies were taken from throughout the colon and these were sent in pathology jar #3.  In the sigmoid colon a 1 cm sessile polyp was removed with hot snare and sent to pathology, this was jar #4.  The examination was otherwise normal  RECOMMENDATIONS: Await final pathology, she will likely need a repeat colonoscopy in 3-5 years.  I recommended she try her best to quit drinking so much alcohol.  I also recommended she try Imodium scheduled basis shortly after waking every morning.  eSigned:  Milus Banister, MD 06/21/2014 2:47 PM Revised: 06/21/2014 2:47 PM

## 2014-06-25 ENCOUNTER — Encounter: Payer: Self-pay | Admitting: Gastroenterology

## 2014-08-31 ENCOUNTER — Ambulatory Visit (INDEPENDENT_AMBULATORY_CARE_PROVIDER_SITE_OTHER): Payer: 59 | Admitting: Emergency Medicine

## 2014-08-31 ENCOUNTER — Encounter: Payer: Self-pay | Admitting: Emergency Medicine

## 2014-08-31 VITALS — BP 110/80 | HR 88 | Temp 98.3°F | Resp 16 | Ht 60.5 in | Wt 135.2 lb

## 2014-08-31 DIAGNOSIS — B379 Candidiasis, unspecified: Secondary | ICD-10-CM

## 2014-08-31 DIAGNOSIS — Z23 Encounter for immunization: Secondary | ICD-10-CM

## 2014-08-31 DIAGNOSIS — F411 Generalized anxiety disorder: Secondary | ICD-10-CM

## 2014-08-31 DIAGNOSIS — I10 Essential (primary) hypertension: Secondary | ICD-10-CM

## 2014-08-31 LAB — COMPLETE METABOLIC PANEL WITH GFR
ALBUMIN: 4.4 g/dL (ref 3.5–5.2)
ALT: 29 U/L (ref 0–35)
AST: 35 U/L (ref 0–37)
Alkaline Phosphatase: 66 U/L (ref 39–117)
BILIRUBIN TOTAL: 0.3 mg/dL (ref 0.2–1.2)
BUN: 9 mg/dL (ref 6–23)
CALCIUM: 9.3 mg/dL (ref 8.4–10.5)
CHLORIDE: 101 meq/L (ref 96–112)
CO2: 25 meq/L (ref 19–32)
Creat: 0.56 mg/dL (ref 0.50–1.10)
GFR, Est African American: >89 mL/min
GLUCOSE: 90 mg/dL (ref 70–99)
POTASSIUM: 3.9 meq/L (ref 3.5–5.3)
SODIUM: 138 meq/L (ref 135–145)
TOTAL PROTEIN: 7.2 g/dL (ref 6.0–8.3)

## 2014-08-31 LAB — CBC WITH DIFFERENTIAL/PLATELET
BASOS ABS: 0 10*3/uL (ref 0.0–0.1)
Basophils Relative: 0 % (ref 0–1)
EOS PCT: 1 % (ref 0–5)
Eosinophils Absolute: 0.1 10*3/uL (ref 0.0–0.7)
HCT: 41.4 % (ref 36.0–46.0)
Hemoglobin: 14.2 g/dL (ref 12.0–15.0)
LYMPHS PCT: 19 % (ref 12–46)
Lymphs Abs: 1.4 10*3/uL (ref 0.7–4.0)
MCH: 31.6 pg (ref 26.0–34.0)
MCHC: 34.3 g/dL (ref 30.0–36.0)
MCV: 92 fL (ref 78.0–100.0)
MPV: 9.9 fL (ref 8.6–12.4)
Monocytes Absolute: 0.7 10*3/uL (ref 0.1–1.0)
Monocytes Relative: 9 % (ref 3–12)
NEUTROS PCT: 71 % (ref 43–77)
Neutro Abs: 5.2 10*3/uL (ref 1.7–7.7)
PLATELETS: 329 10*3/uL (ref 150–400)
RBC: 4.5 MIL/uL (ref 3.87–5.11)
RDW: 14.6 % (ref 11.5–15.5)
WBC: 7.3 10*3/uL (ref 4.0–10.5)

## 2014-08-31 LAB — TSH: TSH: 0.653 u[IU]/mL (ref 0.350–4.500)

## 2014-08-31 MED ORDER — LISINOPRIL-HYDROCHLOROTHIAZIDE 10-12.5 MG PO TABS: 1.0000 | ORAL_TABLET | Freq: Every day | ORAL | Status: DC

## 2014-08-31 MED ORDER — CLOTRIMAZOLE-BETAMETHASONE 1-0.05 % EX CREA: 1.0000 "application " | TOPICAL_CREAM | Freq: Two times a day (BID) | CUTANEOUS | Status: DC

## 2014-08-31 NOTE — Progress Notes (Addendum)
Subjective:  This chart was scribed for Ruth Russian, MD by Ladene Artist, ED Scribe. The patient was seen in room 23. Patient's care was started at 9:15 AM.   Patient ID: Ruth Perez, female    DOB: [birth year], 49 y.o.   MRN: [id number]  Chief Complaint  Patient presents with  . Follow-up  . Hypertension  . pt had urology evaluation, Colonoscopy  . Medication Refill    pt would like refill on vaginal cream Lotrisone Cream   HPI HPI Comments: Ruth Perez is a 49 y.o. female, with a h/o HTN, hyperlipidemia, who presents to the [facility] for a follow-up and medication refill of Lotrisone cream.   Urology Pt followed-up with an urologist since her last visit. She had a scan done that showed a calcium build-up in the aorta which is attributed to smoking.   GI Pt had a colonoscopy since she was last seen. She had 5 polyps removed and was advised to take an Imodium daily.   Menopause Pt reports intermittent hot flashes and chills that typically last for a week and temporarily resolve for a few weeks before returning. She also reports associated difficulty focusing and recently noticed irritability. She states "I'm just not my friendly self or a people person anymore". 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 Pt states that she has experienced symptoms for a littler over a year now.  Preventative Maintenance  Pt has not had her mammogram done yet. She plans to have it done when she can afford it.   Neck Pain Pt reports ongoing intermittent neck pain. She suspects that pain is attributed to the way she sleeps. She has tried sleeping with various pillows and resting her head on a rolled towel without relief. Pt states that pain is so severe at times that she is unable to lift her head off the pillow in the mornings. Neck pain is exacerbated with movement.   Job Pt is still  working at [employer]. She states that she needs a change since the owner does not file taxes, give holiday pay or offer insurance. She states that she is looking forward to the next couple of weeks since this is their busiest season.   Pt consumes 6-7 beers on the weekends and smokes less than 1 ppd. She expresses her desire to try e-cigarettes. Pt's last CXR was 02/2014.   Past Medical History  Diagnosis Date  . Hypertension   . Hyperlipidemia   . Arthritis   . Alcohol abuse    Current Outpatient Prescriptions on File Prior to Visit  Medication Sig Dispense Refill  . clotrimazole-betamethasone (LOTRISONE) cream Apply 1 application topically 2 (two) times daily. 45 g 1  . lisinopril-hydrochlorothiazide (PRINZIDE,ZESTORETIC) 10-12.5 MG per tablet Take 1 tablet by mouth daily. 90 tablet 3  . cyclobenzaprine (FLEXERIL) 10 MG tablet Take a half tablet during the day if needed and one tablet at night. (Patient not taking: Reported on 08/31/2014) 30 tablet 5  . fluticasone (FLONASE) 50 MCG/ACT nasal spray Place 2 sprays into the nose daily. (Patient not taking: Reported on 08/31/2014) 16 g 6   No current facility-administered medications on file prior to visit.   Allergies  Allergen Reactions  . Codeine Nausea And Vomiting   Review of Systems  Constitutional: Positive for chills and diaphoresis.  Musculoskeletal: Positive for neck pain.  Psychiatric/Behavioral: Positive for decreased  concentration and agitation.      Objective:   Physical Exam CONSTITUTIONAL: Well developed/well nourished HEAD: Normocephalic/atraumatic EYES: EOMI/PERRL ENMT: Mucous membranes moist NECK: supple no meningeal signs SPINE/BACK:entire spine nontender CV: S1/S2 noted, no murmurs/rubs/gallops noted LUNGS: Lungs are clear to auscultation bilaterally, no apparent distress ABDOMEN: soft, nontender, no rebound or guarding, bowel sounds noted throughout abdomen GU:no cva tenderness NEURO: Pt is  awake/alert/appropriate, moves all extremitiesx4.  No facial droop.   EXTREMITIES: pulses normal/equal, full ROM SKIN: warm, color normal, extensive hirsutism  PSYCH: no abnormalities of mood noted, alert and oriented to situation    Assessment & Plan:   Patient unable to cut back on her smoking and drinking. She understands the risks of that. She will try e- cigarettes. She did go and follow-up with the urologist and gastroenterologist. She declines to have a mammogram scheduled at the present time. I did not put her on a baby aspirin a day because of her drinking history she declined to get a flu shot . T dap given today .Marland KitchenI personally performed the services described in this documentation, which was scribed in my presence. The recorded information has been reviewed and is accurate.

## 2014-08-31 NOTE — Patient Instructions (Addendum)
Try Citrucel one packet a day to help as a bowel normalizer. Try the black cohosh and see if that helps with your menopausal symptoms. Repeat physical in 6 months  Smoking Cessation Quitting smoking is important to your health and has many advantages. However, it is not always easy to quit since nicotine is a very addictive drug. Oftentimes, people try 3 times or more before being able to quit. This document explains the best ways for you to prepare to quit smoking. Quitting takes hard work and a lot of effort, but you can do it. ADVANTAGES OF QUITTING SMOKING  You will live longer, feel better, and live better.  Your body will feel the impact of quitting smoking almost immediately.  Within 20 minutes, blood pressure decreases. Your pulse returns to its normal level.  After 8 hours, carbon monoxide levels in the blood return to normal. Your oxygen level increases.  After 24 hours, the chance of having a heart attack starts to decrease. Your breath, hair, and body stop smelling like smoke.  After 48 hours, damaged nerve endings begin to recover. Your sense of taste and smell improve.  After 72 hours, the body is virtually free of nicotine. Your bronchial tubes relax and breathing becomes easier.  After 2 to 12 weeks, lungs can hold more air. Exercise becomes easier and circulation improves.  The risk of having a heart attack, stroke, cancer, or lung disease is greatly reduced.  After 1 year, the risk of coronary heart disease is cut in half.  After 5 years, the risk of stroke falls to the same as a nonsmoker.  After 10 years, the risk of lung cancer is cut in half and the risk of other cancers decreases significantly.  After 15 years, the risk of coronary heart disease drops, usually to the level of a nonsmoker.  If you are pregnant, quitting smoking will improve your chances of having a healthy baby.  The people you live with, especially any children, will be healthier.  You  will have extra money to spend on things other than cigarettes. QUESTIONS TO THINK ABOUT BEFORE ATTEMPTING TO QUIT You may want to talk about your answers with your health care provider.  Why do you want to quit?  If you tried to quit in the past, what helped and what did not?  What will be the most difficult situations for you after you quit? How will you plan to handle them?  Who can help you through the tough times? Your family? Friends? A health care provider?  What pleasures do you get from smoking? What ways can you still get pleasure if you quit? Here are some questions to ask your health care provider:  How can you help me to be successful at quitting?  What medicine do you think would be best for me and how should I take it?  What should I do if I need more help?  What is smoking withdrawal like? How can I get information on withdrawal? GET READY  Set a quit date.  Change your environment by getting rid of all cigarettes, ashtrays, matches, and lighters in your home, car, or work. Do not let people smoke in your home.  Review your past attempts to quit. Think about what worked and what did not. GET SUPPORT AND ENCOURAGEMENT You have a better chance of being successful if you have help. You can get support in many ways.  Tell your family, friends, and coworkers that you are going  to quit and need their support. Ask them not to smoke around you.  Get individual, group, or telephone counseling and support. Programs are available at [employer] and health centers. Call your local health department for information about programs in your area.  Spiritual beliefs and practices may help some smokers quit.  Download a "quit meter" on your computer to keep track of quit statistics, such as how long you have gone without smoking, cigarettes not smoked, and money saved.  Get a self-help book about quitting smoking and staying off tobacco. [place] yourself from urges to smoke. Talk to someone, go for a walk, or occupy your time with a task.  Change your normal routine. Take a different route to work. Drink tea instead of coffee. Eat breakfast in a different place.  Reduce your stress. Take a hot bath, exercise, or read a book.  Plan something enjoyable to do every day. Reward yourself for not smoking.  Explore interactive web-based programs that specialize in helping you quit. GET MEDICINE AND USE IT CORRECTLY Medicines can help you stop smoking and decrease the urge to smoke. Combining medicine with the above behavioral methods and support can greatly increase your chances of successfully quitting smoking.  Nicotine replacement therapy helps deliver nicotine to your body without the negative effects and risks of smoking. Nicotine replacement therapy includes nicotine gum, lozenges, inhalers, nasal sprays, and skin patches. Some may be available over-the-counter and others require a prescription.  Antidepressant medicine helps people abstain from smoking, but how this works is unknown. This medicine is available by prescription.  Nicotinic receptor partial agonist medicine simulates the effect of nicotine in your brain. This medicine is available by prescription. Ask your health care provider for advice about which medicines to use and how to use them based on your health history. Your health care provider will tell you what side effects to look out for if you choose to be on a medicine or therapy. Carefully read the information on the package. Do not use any other product containing nicotine while using a nicotine replacement product.  RELAPSE OR DIFFICULT SITUATIONS Most relapses occur within the first 3 months after quitting. Do not be discouraged if you start smoking again. Remember, most people try several times before finally quitting. You may have symptoms of withdrawal because your body is used to nicotine. You  may crave cigarettes, be irritable, feel very hungry, cough often, get headaches, or have difficulty concentrating. The withdrawal symptoms are only temporary. They are strongest when you first quit, but they will go away within 10-14 days. To reduce the chances of relapse, try to:  Avoid drinking alcohol. Drinking lowers your chances of successfully quitting.  Reduce the amount of caffeine you consume. Once you quit smoking, the amount of caffeine in your body increases and can give you symptoms, such as a rapid heartbeat, sweating, and anxiety.  Avoid smokers because they can make you want to smoke.  Do not let weight gain distract you. Many smokers will gain weight when they quit, usually less than 10 pounds. Eat a healthy diet and stay active. You can always lose the weight gained after you quit.  Find ways to improve your mood other than smoking. FOR MORE INFORMATION  www.smokefree.gov  Document Released: 06/26/2001 Document Revised: 11/16/2013 Document Reviewed: 10/11/2011 [facility] [place] Patient Information 2015 [place], [place]. This information is not intended to replace advice given to you by your health care provider. Make sure you  discuss any questions you have with your health care provider.  

## 2015-02-17 ENCOUNTER — Ambulatory Visit (HOSPITAL_COMMUNITY)
Admission: RE | Admit: 2015-02-17 | Discharge: 2015-02-17 | Disposition: A | Discharge status: Home / Self Care | Payer: 59 | Source: Ambulatory Visit | Attending: Family Medicine | Admitting: Family Medicine

## 2015-02-17 ENCOUNTER — Inpatient Hospital Stay (HOSPITAL_COMMUNITY)
Admission: EM | Admit: 2015-02-17 | Discharge: 2015-02-23 | DRG: 872 | Disposition: A | Discharge status: Home / Self Care | Payer: 59 | Attending: Internal Medicine | Admitting: Internal Medicine

## 2015-02-17 ENCOUNTER — Encounter (HOSPITAL_COMMUNITY): Payer: Self-pay | Admitting: Emergency Medicine

## 2015-02-17 ENCOUNTER — Ambulatory Visit (INDEPENDENT_AMBULATORY_CARE_PROVIDER_SITE_OTHER): Payer: 59 | Admitting: Family Medicine

## 2015-02-17 ENCOUNTER — Encounter (HOSPITAL_COMMUNITY): Payer: Self-pay

## 2015-02-17 ENCOUNTER — Other Ambulatory Visit: Payer: Self-pay

## 2015-02-17 VITALS — BP 132/70 | HR 112 | Temp 102.0°F | Resp 16 | Ht 61.0 in | Wt 131.0 lb

## 2015-02-17 DIAGNOSIS — K5720 Diverticulitis of large intestine with perforation and abscess without bleeding: Secondary | ICD-10-CM | POA: Diagnosis present

## 2015-02-17 DIAGNOSIS — E785 Hyperlipidemia, unspecified: Secondary | ICD-10-CM | POA: Diagnosis present

## 2015-02-17 DIAGNOSIS — M1990 Unspecified osteoarthritis, unspecified site: Secondary | ICD-10-CM | POA: Diagnosis present

## 2015-02-17 DIAGNOSIS — E876 Hypokalemia: Secondary | ICD-10-CM | POA: Diagnosis present

## 2015-02-17 DIAGNOSIS — F1010 Alcohol abuse, uncomplicated: Secondary | ICD-10-CM

## 2015-02-17 DIAGNOSIS — R1030 Lower abdominal pain, unspecified: Secondary | ICD-10-CM

## 2015-02-17 DIAGNOSIS — D259 Leiomyoma of uterus, unspecified: Secondary | ICD-10-CM | POA: Diagnosis present

## 2015-02-17 DIAGNOSIS — A419 Sepsis, unspecified organism: Secondary | ICD-10-CM | POA: Diagnosis present

## 2015-02-17 DIAGNOSIS — Z8249 Family history of ischemic heart disease and other diseases of the circulatory system: Secondary | ICD-10-CM | POA: Diagnosis not present

## 2015-02-17 DIAGNOSIS — I10 Essential (primary) hypertension: Secondary | ICD-10-CM | POA: Diagnosis present

## 2015-02-17 DIAGNOSIS — K869 Disease of pancreas, unspecified: Secondary | ICD-10-CM

## 2015-02-17 DIAGNOSIS — R509 Fever, unspecified: Secondary | ICD-10-CM | POA: Diagnosis not present

## 2015-02-17 DIAGNOSIS — F1020 Alcohol dependence, uncomplicated: Secondary | ICD-10-CM | POA: Diagnosis present

## 2015-02-17 DIAGNOSIS — Z8371 Family history of colonic polyps: Secondary | ICD-10-CM | POA: Diagnosis not present

## 2015-02-17 DIAGNOSIS — I700 Atherosclerosis of aorta: Secondary | ICD-10-CM | POA: Diagnosis present

## 2015-02-17 DIAGNOSIS — Z79899 Other long term (current) drug therapy: Secondary | ICD-10-CM

## 2015-02-17 DIAGNOSIS — Z885 Allergy status to narcotic agent status: Secondary | ICD-10-CM

## 2015-02-17 DIAGNOSIS — R6510 Systemic inflammatory response syndrome (SIRS) of non-infectious origin without acute organ dysfunction: Secondary | ICD-10-CM | POA: Diagnosis present

## 2015-02-17 DIAGNOSIS — D72829 Elevated white blood cell count, unspecified: Secondary | ICD-10-CM

## 2015-02-17 DIAGNOSIS — K5790 Diverticulosis of intestine, part unspecified, without perforation or abscess without bleeding: Secondary | ICD-10-CM

## 2015-02-17 DIAGNOSIS — R319 Hematuria, unspecified: Secondary | ICD-10-CM | POA: Diagnosis present

## 2015-02-17 DIAGNOSIS — Z808 Family history of malignant neoplasm of other organs or systems: Secondary | ICD-10-CM | POA: Diagnosis not present

## 2015-02-17 DIAGNOSIS — R51 Headache: Secondary | ICD-10-CM | POA: Diagnosis not present

## 2015-02-17 DIAGNOSIS — K8689 Other specified diseases of pancreas: Secondary | ICD-10-CM

## 2015-02-17 DIAGNOSIS — F17210 Nicotine dependence, cigarettes, uncomplicated: Secondary | ICD-10-CM | POA: Diagnosis present

## 2015-02-17 DIAGNOSIS — Z87448 Personal history of other diseases of urinary system: Secondary | ICD-10-CM | POA: Diagnosis not present

## 2015-02-17 DIAGNOSIS — F17200 Nicotine dependence, unspecified, uncomplicated: Secondary | ICD-10-CM | POA: Diagnosis present

## 2015-02-17 DIAGNOSIS — K5792 Diverticulitis of intestine, part unspecified, without perforation or abscess without bleeding: Secondary | ICD-10-CM | POA: Diagnosis not present

## 2015-02-17 LAB — COMPREHENSIVE METABOLIC PANEL
ALT: 28 U/L (ref 14–54)
AST: 38 U/L (ref 15–41)
Albumin: 4 g/dL (ref 3.5–5.0)
Alkaline Phosphatase: 66 U/L (ref 38–126)
Anion gap: 12 (ref 5–15)
BUN: 8 mg/dL (ref 6–20)
CALCIUM: 9.1 mg/dL (ref 8.9–10.3)
CHLORIDE: 97 mmol/L — AB (ref 101–111)
CO2: 24 mmol/L (ref 22–32)
CREATININE: 0.55 mg/dL (ref 0.44–1.00)
GFR calc Af Amer: >60 mL/min (ref >60)
GFR calc non Af Amer: >60 mL/min (ref >60)
GLUCOSE: 93 mg/dL (ref 65–99)
Potassium: 3.5 mmol/L (ref 3.5–5.1)
SODIUM: 133 mmol/L — AB (ref 135–145)
Total Bilirubin: 1.1 mg/dL (ref 0.3–1.2)
Total Protein: 7.7 g/dL (ref 6.5–8.1)

## 2015-02-17 LAB — POCT UA - MICROSCOPIC ONLY
Bacteria, U Microscopic: NEGATIVE
Casts, Ur, LPF, POC: NEGATIVE
Crystals, Ur, HPF, POC: NEGATIVE
Yeast, UA: NEGATIVE

## 2015-02-17 LAB — POCT CBC
Granulocyte percent: 88.3 %G — AB (ref 37–80)
HCT, POC: 39.6 % (ref 37.7–47.9)
Hemoglobin: 13.5 g/dL (ref 12.2–16.2)
LYMPH, POC: 1.1 (ref 0.6–3.4)
MCH, POC: 31 pg (ref 27–31.2)
MCHC: 34.1 g/dL (ref 31.8–35.4)
MCV: 91.1 fL (ref 80–97)
MID (cbc): 0.9 (ref 0–0.9)
MPV: 7.2 fL (ref 0–99.8)
POC Granulocyte: 14.7 — AB (ref 2–6.9)
POC LYMPH PERCENT: 6.5 %L — AB (ref 10–50)
POC MID %: 5.2 %M (ref 0–12)
Platelet Count, POC: 307 10*3/uL (ref 142–424)
RBC: 4.35 M/uL (ref 4.04–5.48)
RDW, POC: 13.8 %
WBC: 16.6 10*3/uL — AB (ref 4.6–10.2)

## 2015-02-17 LAB — LIPASE, BLOOD: Lipase: 17 U/L — ABNORMAL LOW (ref 22–51)

## 2015-02-17 LAB — CBC
HCT: 35.9 % — ABNORMAL LOW (ref 36.0–46.0)
HEMOGLOBIN: 12.3 g/dL (ref 12.0–15.0)
MCH: 31.9 pg (ref 26.0–34.0)
MCHC: 34.3 g/dL (ref 30.0–36.0)
MCV: 93.2 fL (ref 78.0–100.0)
Platelets: 256 10*3/uL (ref 150–400)
RBC: 3.85 MIL/uL — ABNORMAL LOW (ref 3.87–5.11)
RDW: 13.6 % (ref 11.5–15.5)
WBC: 15.5 10*3/uL — AB (ref 4.0–10.5)

## 2015-02-17 LAB — POCT URINALYSIS DIPSTICK
Glucose, UA: NEGATIVE
Nitrite, UA: NEGATIVE
Protein, UA: 100
SPEC GRAV UA: 1.015
Urobilinogen, UA: 1
pH, UA: 6

## 2015-02-17 LAB — LACTIC ACID, PLASMA
LACTIC ACID, VENOUS: 0.7 mmol/L (ref 0.5–2.0)
Lactic Acid, Venous: 1 mmol/L (ref 0.5–2.0)

## 2015-02-17 LAB — URINALYSIS, ROUTINE W REFLEX MICROSCOPIC
BILIRUBIN URINE: NEGATIVE
Glucose, UA: NEGATIVE mg/dL
Ketones, ur: NEGATIVE mg/dL
Leukocytes, UA: NEGATIVE
Nitrite: NEGATIVE
PH: 6 (ref 5.0–8.0)
PROTEIN: NEGATIVE mg/dL
Urobilinogen, UA: 0.2 mg/dL (ref 0.0–1.0)

## 2015-02-17 LAB — URINE MICROSCOPIC-ADD ON

## 2015-02-17 MED ORDER — ONDANSETRON HCL 4 MG/2ML IJ SOLN
4.0000 mg | Freq: Once | INTRAMUSCULAR | Status: DC
  Filled 2015-02-17: qty 2

## 2015-02-17 MED ORDER — CIPROFLOXACIN IN D5W 400 MG/200ML IV SOLN
400.0000 mg | Freq: Once | INTRAVENOUS | Status: AC
  Administered 2015-02-17: 400 mg via INTRAVENOUS
  Filled 2015-02-17: qty 200

## 2015-02-17 MED ORDER — IBUPROFEN 200 MG PO TABS
400.0000 mg | ORAL_TABLET | Freq: Once | ORAL | Status: AC
  Administered 2015-02-17: 400 mg via ORAL

## 2015-02-17 MED ORDER — VITAMIN B-1 100 MG PO TABS
100.0000 mg | ORAL_TABLET | Freq: Every day | ORAL | Status: DC
  Administered 2015-02-17 – 2015-02-23 (×6): 100 mg via ORAL
  Filled 2015-02-17 (×7): qty 1

## 2015-02-17 MED ORDER — DIPHENHYDRAMINE HCL 25 MG PO CAPS
25.0000 mg | ORAL_CAPSULE | Freq: Once | ORAL | Status: AC
  Administered 2015-02-17: 25 mg via ORAL
  Filled 2015-02-17: qty 1

## 2015-02-17 MED ORDER — ACETAMINOPHEN 325 MG PO TABS
650.0000 mg | ORAL_TABLET | Freq: Four times a day (QID) | ORAL | Status: DC | PRN
  Administered 2015-02-17 – 2015-02-21 (×11): 650 mg via ORAL
  Filled 2015-02-17 (×11): qty 2

## 2015-02-17 MED ORDER — NICOTINE 21 MG/24HR TD PT24
21.0000 mg | MEDICATED_PATCH | Freq: Every day | TRANSDERMAL | Status: DC
  Administered 2015-02-17 – 2015-02-23 (×7): 21 mg via TRANSDERMAL
  Filled 2015-02-17 (×7): qty 1

## 2015-02-17 MED ORDER — ADULT MULTIVITAMIN W/MINERALS CH
1.0000 | ORAL_TABLET | Freq: Every day | ORAL | Status: DC
  Administered 2015-02-18 – 2015-02-23 (×5): 1 via ORAL
  Filled 2015-02-17 (×7): qty 1

## 2015-02-17 MED ORDER — FOLIC ACID 1 MG PO TABS
1.0000 mg | ORAL_TABLET | Freq: Every day | ORAL | Status: DC
  Administered 2015-02-18 – 2015-02-23 (×5): 1 mg via ORAL
  Filled 2015-02-17 (×7): qty 1

## 2015-02-17 MED ORDER — SODIUM CHLORIDE 0.9 % IV BOLUS (SEPSIS)
1000.0000 mL | Freq: Once | INTRAVENOUS | Status: AC
  Administered 2015-02-17: 1000 mL via INTRAVENOUS

## 2015-02-17 MED ORDER — POTASSIUM CHLORIDE 2 MEQ/ML IV SOLN
INTRAVENOUS | Status: DC
  Administered 2015-02-17: 19:00:00 via INTRAVENOUS
  Filled 2015-02-17 (×3): qty 1000

## 2015-02-17 MED ORDER — SODIUM CHLORIDE 0.9 % IV BOLUS (SEPSIS): 1000.0000 mL | INTRAVENOUS | Status: DC | PRN

## 2015-02-17 MED ORDER — ONDANSETRON HCL 4 MG/2ML IJ SOLN: 4.0000 mg | Freq: Four times a day (QID) | INTRAMUSCULAR | Status: DC | PRN

## 2015-02-17 MED ORDER — ENOXAPARIN SODIUM 40 MG/0.4ML ~~LOC~~ SOLN
40.0000 mg | SUBCUTANEOUS | Status: DC
  Administered 2015-02-17 – 2015-02-22 (×6): 40 mg via SUBCUTANEOUS
  Filled 2015-02-17 (×7): qty 0.4

## 2015-02-17 MED ORDER — MORPHINE SULFATE 2 MG/ML IJ SOLN: 2.0000 mg | INTRAMUSCULAR | Status: DC | PRN

## 2015-02-17 MED ORDER — MORPHINE SULFATE 4 MG/ML IJ SOLN
4.0000 mg | INTRAMUSCULAR | Status: DC | PRN
  Filled 2015-02-17: qty 1

## 2015-02-17 MED ORDER — ONDANSETRON HCL 4 MG PO TABS: 4.0000 mg | ORAL_TABLET | Freq: Four times a day (QID) | ORAL | Status: DC | PRN

## 2015-02-17 MED ORDER — LORAZEPAM 1 MG PO TABS: 1.0000 mg | ORAL_TABLET | Freq: Four times a day (QID) | ORAL | Status: AC | PRN

## 2015-02-17 MED ORDER — ACETAMINOPHEN 650 MG RE SUPP: 650.0000 mg | Freq: Four times a day (QID) | RECTAL | Status: DC | PRN

## 2015-02-17 MED ORDER — LORAZEPAM 2 MG/ML IJ SOLN
1.0000 mg | Freq: Four times a day (QID) | INTRAMUSCULAR | Status: AC | PRN
  Administered 2015-02-18 – 2015-02-20 (×4): 1 mg via INTRAVENOUS
  Filled 2015-02-17 (×4): qty 1

## 2015-02-17 MED ORDER — IOHEXOL 300 MG/ML  SOLN
25.0000 mL | INTRAMUSCULAR | Status: AC
  Administered 2015-02-17: 25 mL via ORAL

## 2015-02-17 MED ORDER — POTASSIUM CHLORIDE 10 MEQ/100ML IV SOLN
10.0000 meq | INTRAVENOUS | Status: AC
  Administered 2015-02-17 (×4): 10 meq via INTRAVENOUS
  Filled 2015-02-17 (×4): qty 100

## 2015-02-17 MED ORDER — CIPROFLOXACIN IN D5W 400 MG/200ML IV SOLN
400.0000 mg | Freq: Two times a day (BID) | INTRAVENOUS | Status: DC
  Administered 2015-02-18 (×2): 400 mg via INTRAVENOUS
  Filled 2015-02-17 (×2): qty 200

## 2015-02-17 MED ORDER — THIAMINE HCL 100 MG/ML IJ SOLN
100.0000 mg | Freq: Every day | INTRAMUSCULAR | Status: DC
  Filled 2015-02-17 (×6): qty 1

## 2015-02-17 MED ORDER — METRONIDAZOLE IN NACL 5-0.79 MG/ML-% IV SOLN
500.0000 mg | Freq: Once | INTRAVENOUS | Status: AC
  Administered 2015-02-17: 500 mg via INTRAVENOUS

## 2015-02-17 MED ORDER — METRONIDAZOLE IN NACL 5-0.79 MG/ML-% IV SOLN
500.0000 mg | Freq: Three times a day (TID) | INTRAVENOUS | Status: DC
  Administered 2015-02-18 (×2): 500 mg via INTRAVENOUS
  Filled 2015-02-17 (×4): qty 100

## 2015-02-17 MED ORDER — IOHEXOL 300 MG/ML  SOLN
100.0000 mL | Freq: Once | INTRAMUSCULAR | Status: AC | PRN
  Administered 2015-02-17: 100 mL via INTRAVENOUS

## 2015-02-17 NOTE — Progress Notes (Signed)
BP 126/77 mmHg  Pulse 92  Temp(Src) 98.7 F (37.1 C)  Resp 18  SpO2 100%  LMP 02/18/2013 49 year old female that went  went to her PCPs office for fever and abdominal pain that started 1 day prior to admission. In the offices her temperature was 102. She also has some nausea but no vomiting. On physical exam she's guarding a CBC was done a white count of 16 so she was sent for CT scan that showed diverticular gnosis with diverticulitis and a possible abscess. She also had a 7 mm pancreatic mass will probably need an MRI. She will need a surgical consult for possible drain placement and IV antibiotics.
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
## 2015-02-17 NOTE — ED Notes (Signed)
Per pt/radiologist, had CT today and has diverticulitis and an abdominal abscess-sent here for admission-patient has been having fever and abdominal pain since yesterday

## 2015-02-17 NOTE — Patient Instructions (Signed)
We will check a ct scan this morning, then determine next step. Nothing to eat or drink for now in case there is something on your CT scan requiring surgery.

## 2015-02-17 NOTE — H&P (Signed)
[facility] History and Physical  Ruth Perez IFO:[id number] DOB: [birth year] DOA: 02/17/2015  Referring physician: Tanna Furry, M.D. PCP: Jenny Reichmann, MD   Chief Complaint:  Lower abdominal pain and fever 1 day  HPI:  50 year old female with history of hypertension, hyperlipidemia, tobacco and alcohol abuse , colonoscopy in 06/2014 with multiple polyps removed (pathology showing tubular adenoma without signs of high-grade dysplasia or malignancy), history of hematuria with normal cystoscopy in 2015 (sees Dr Diona Fanti) , presented to her PCP with 1 day history of bilateral lower quadrant abdominal pain associated with nausea but no vomiting. The pain was crampy 7-8/10 in severity, nonradiating and without any aggravating or relieving factors. She reports having a fever of 101F yesterday afternoon and this morning as well. She denies eating outside, recent travel or any sick contact. Denies being on antibiotics recently. Reports having an episode of loose bowel movement today but denies any blood per rectum or bloody stool. Patient denies headache, dizziness,  vomiting, chest pain, palpitations, SOB,  or urinary symptoms. Denies change in weight or appetite. Patient seen by her PCP and blood work done not showing leukocytosis with WBC of 16.6. Patient sent to the ED. A CT scan of the abdomen and pelvis done in the ED showed sigmoid diverticulosis with diverticulitis and severe pericolonic inflammatory changes with possible abscess formation at this into the distal sigmoid colon. Comments on no drainable fluid collection. Also shows an incidental finding of 7 mm hypoattenuated pancreatic mass and recommends further evaluation with an MRI. Also is on a lighting appearing focus within the right side of L3 vertebral body about 5 mm in diameter which is possibly a nonspecific finding. Patient in the ED had a 2-D Fahrenheit, tachycardic to 112 with normal respiratory rate and blood pressure.  Sats were normal. Blood work be to assure WBC of 15.5. Patient met criteria for SIRS and hospitalist admission requested to medical floor. Patient was given a dose of IV ciprofloxacin and Flagyl in the ED.   Review of Systems:  Constitutional:  fever, chills, denies diaphoresis, appetite change and fatigue.  HEENT: Denies visual or hearing symptoms, congestion, sore throat, cough, difficulty swallowing, neck pain or stiffness Respiratory: Denies SOB, DOE, cough, chest tightness,  and wheezing.   Cardiovascular: Denies chest pain, palpitations and leg swelling.  Gastrointestinal:  nausea,abdominal pain, denies vomiting, diarrhea, constipation, blood in stool and abdominal distention.  Genitourinary: Denies dysuria,  hematuria, flank pain and difficulty urinating.  Endocrine: Denies: hot or cold intolerance, polyuria, polydipsia. Musculoskeletal: Denies myalgias, back pain, joint pain or swelling. Skin: Denies pallor, rash and wound.  Neurological: Denies dizziness,syncope, weakness, light-headedness, numbness and headaches.  Hematological: Denies adenopathy.  Psychiatric/Behavioral: Denies confusion   Past Medical History  Diagnosis Date  . Hypertension   . Hyperlipidemia   . Arthritis   . Alcohol abuse    Past Surgical History  Procedure Laterality Date  . Tonsillectomy    . Mouth surgery     Social History:  reports that she has been smoking Cigarettes.  She has a 35 pack-year smoking history. She has never used smokeless tobacco. She reports that she drinks about 29.4 oz of alcohol per week. She reports that she uses illicit drugs (Marijuana).  Allergies  Allergen Reactions  . Codeine Nausea And Vomiting    Family History  Problem Relation Age of Onset  . Heart disease Mother   . Kidney disease Father   . Colon polyps Father   . Heart disease Father   .  Aneurysm Father     AAA  . Neuropathy Father   . Thyroid cancer Mother     Prior to Admission medications    Medication Sig Start Date End Date Taking? Authorizing Provider  lisinopril-hydrochlorothiazide (PRINZIDE,ZESTORETIC) 10-12.5 MG per tablet Take 1 tablet by mouth daily. 08/31/14  Yes Darlyne Russian, MD     Physical Exam:  Filed Vitals:   02/17/15 1555  BP: 126/77  Pulse: 92  Temp: 98.7 F (37.1 C)  Resp: 18  SpO2: 100%    Constitutional: Vital signs reviewed. Middle aged female in no acute distress HEENT: no pallor, no icterus, moist oral mucosa, no cervical lymphadenopathy, supple neck Cardiovascular: RRR, S1 normal, S2 normal, no MRG Chest: CTAB, no wheezes, rales, or rhonchi Abdominal: Soft. Nondistended, bowel sounds present, tender to palpation over bilateral lower quadrants, no guarding or rigidity Ext: warm, no edema Neurological: Alert and oriented, no tremors  Labs on Admission:  Basic Metabolic Panel:  Recent Labs Lab 02/17/15 1624  NA 133*  K 3.5  CL 97*  CO2 24  GLUCOSE 93  BUN 8  CREATININE 0.55  CALCIUM 9.1   Liver Function Tests:  Recent Labs Lab 02/17/15 1624  AST 38  ALT 28  ALKPHOS 66  BILITOT 1.1  PROT 7.7  ALBUMIN 4.0    Recent Labs Lab 02/17/15 1624  LIPASE 17*   No results for input(s): AMMONIA in the last 168 hours. CBC:  Recent Labs Lab 02/17/15 1033  WBC 16.6*  HGB 13.5  HCT 39.6  MCV 91.1   Cardiac Enzymes: No results for input(s): CKTOTAL, CKMB, CKMBINDEX, TROPONINI in the last 168 hours. BNP: Invalid input(s): POCBNP CBG: No results for input(s): GLUCAP in the last 168 hours.  Radiological Exams on Admission: Ct Abdomen Pelvis W Contrast  02/17/2015   CLINICAL DATA:  Lower pelvic pain for 1 day. Fever. Elevated white blood cell count.  EXAM: CT ABDOMEN AND PELVIS WITH CONTRAST  TECHNIQUE: Multidetector CT imaging of the abdomen and pelvis was performed using the standard protocol following bolus administration of intravenous contrast.  CONTRAST:  25 mL OMNIPAQUE IOHEXOL 300 MG/ML SOLN GIVEN ORALLY, 165mL  OMNIPAQUE IOHEXOL 300 MG/ML SOLN GIVEN INTRAVENOUSLY.  COMPARISON:  CT of the abdomen and pelvis dated 05/05/2014  FINDINGS: Lower chest:  Unremarkable.  Hepatobiliary: No masses or other significant abnormality identified.  Pancreas: There is a 0.7 by 0.5 cm circumscribed hypoattenuated lesion within the body of the pancreas. The pancreas otherwise appears unremarkable.  Spleen:  Within normal limits in size and appearance.  Adrenal Glands: No masses identified. There is mild diffuse thickening of the left adrenal gland.  Kidneys/Urinary Tract: No evidence of urolithiasis or hydronephrosis. No solid or complex cystic renal masses identified. No masses or calculi seen involving the lower urinary tract.  Stomach/Bowel/Peritoneum: The appendix is normal. Stomach and small bowel are normal. There is diffuse circumferential wall thickening of the descending colon, and a even more so of the sigmoid colon. There are several sigmoid diverticula with significant pericolonic inflammatory changes adjacent to the lateral portion of the distal sigmoid colon where there is fat stranding, small amount of free fluid and a gas bubble which is likely extraluminal. (image 60 sequence two).  Vascular/Lymphatic: No pathologically enlarged lymph nodes identified. Heavy calcified atherosclerotic disease of the aorta and common iliac arteries is noted.  Reproductive: The uterus is enlarged and lobular appearing likely due to presence of fibroids. There is a partially calcified exophytic sub mucosal fibroid off of  the right uterine fundus which measures 3.2 cm in diameter and a second apparently intramural fundal fibroid which measures 2.5 cm.  Other:  None.  Musculoskeletal: There is a lytic appearing focus within the right side of L3 vertebral body which measures 5 mm in diameter. This is a nonspecific finding, which in the absence of history of malignancy likely represents a benign finding.  IMPRESSION: Inflammatory changes of the  descending and sigmoid colon.  Sigmoid diverticulosis and evidence of diverticulitis, with severe pericolonic inflammatory changes, and possible abscess formation adjacent to the distal sigmoid colon. No drainable fluid collection is seen. Although the appearance is relatively typical for diverticulosis, correlation to colonoscopy may be considered (after resolution of the acute problem), to exclude underlying malignancy.  Leiomyomatous uterus.  7 mm hypoattenuated pancreatic mass. Further evaluation with MRI of the abdomen may be considered.  Atherosclerotic disease of the aorta.   Electronically Signed   By: Fidela Salisbury M.D.   On: 02/17/2015 15:01    EKG: Pending  Assessment/Plan  Principal Problem:   SIRS (systemic inflammatory response syndrome) Secondary to acute diverticulitis with possible abscess formation to the distal sigmoid colon. -Admit to MedSurg. I will order one little bolus of normal saline followed by D5 with normal saline along with potassium supplement. -Place on empiric Cipro and Flagyl. Serial abdominal exam. -Surgery consulted by ED physician. follow-up with recommendations. -  Active Problems:   Essential hypertension Blood pressure stable. Holding home blood pressure medications. Patient nothing by mouth and hydralazine.    EtOH abuse Counseled on suggestion. Drinks about 6 cans of beer every day. Denies history of withdrawal symptoms. Monitor on CIWA.    Smoker unmotivated to quit  not interested in quitting.  place on nicotine patch.     Pancreatic lesion , 66m on CT scan  Incidental finding. Denies weight loss. Lipase and LFTs was normal. Patient will need MRI of the abdomen once current symptoms improved.  hypokalemia  replenish with IV fluids.   Diet:NPO  DVT prophylaxis: sq lovenox   Code Status: full code Family Communication:  None at bedside Disposition Plan: admit to med - surg  DLouellen Molder[facility] Pager  [phone] Total time spent on admission :60 minutes  If 7PM-7AM, please contact night-coverage [email or web address] Password [email or web address] 02/17/2015, 5:00 PM

## 2015-02-17 NOTE — Progress Notes (Addendum)
Subjective:  This chart was scribed for Corliss Parish, MD by Leandra Kern, Medical Scribe. This patient was seen in Room 12 and the patient's care was started at 10:07 AM.   Patient ID: Ruth Perez, female    DOB: 1965-08-02, 49 y.o.   MRN: [id number]  Chief Complaint  Patient presents with  . Abdominal Pain    started yesterday  . Fever    101.9 today    HPI HPI Comments: Ruth Perez is a 49 y.o. female who presents to [facility] complaining of abdominal pain, gradual onset yesterday morning.  Pt has a history of hematuria, and was evaluate by Dr. Karna Dupes in October of 2015. She has a normal Ct scan form a neurological stand point and a normal cystoscopy, and had a follow up of 1 year. Today pt notes that she woke up with the pain. She reports associated symptoms of fever of 101 onset yesterday afternoon, and still being 101 this morning. Pt also notes that her bowel movement was alternating from solid stool to diarrhea. Pt denies nausea, vomiting, dysuria, hematuria, blood in stool, cough, congestion, trouble breathing. Pt notes that she had a colonoscopy in Dec 2015 by Ardis Hughs. Several polypous but no mention of diverticulosis.   Pt reports that she smokes about 2 pack a day. Also with hx of alcohol abuse- approx 6 pack per day. No known hx of pancreatitis.   Ruth Reichmann, MD    Patient Active Problem List   Diagnosis Date Noted  . Hematuria 03/01/2014  . Smoker unmotivated to quit 04/20/2013  . Unspecified essential hypertension 10/09/2012  . Arthritis of neck 10/09/2012  . EtOH dependence 10/09/2012   Past Medical History  Diagnosis Date  . Hypertension   . Hyperlipidemia   . Arthritis   . Alcohol abuse    Past Surgical History  Procedure Laterality Date  . Tonsillectomy    . Mouth surgery     Allergies  Allergen Reactions  . Codeine Nausea And Vomiting   Prior to Admission medications   Medication Sig Start Date End  Date Taking? Authorizing Provider  lisinopril-hydrochlorothiazide (PRINZIDE,ZESTORETIC) 10-12.5 MG per tablet Take 1 tablet by mouth daily. 08/31/14  Yes Darlyne Russian, MD  clotrimazole-betamethasone (LOTRISONE) cream Apply 1 application topically 2 (two) times daily. Patient not taking: Reported on 02/17/2015 08/31/14   Darlyne Russian, MD  cyclobenzaprine (FLEXERIL) 10 MG tablet Take a half tablet during the day if needed and one tablet at night. Patient not taking: Reported on 08/31/2014 10/09/12   Darlyne Russian, MD  fluticasone [facility]) 50 MCG/ACT nasal spray Place 2 sprays into the nose daily. Patient not taking: Reported on 08/31/2014 10/09/12   Darlyne Russian, MD   History   Social History  . Marital Status: Unknown    Spouse Name: N/A  . Number of Children: 0  . Years of Education: N/A   Occupational History  . Not on file.   Social History Main Topics  . Smoking status: Current Every Day Smoker -- 1.00 packs/day for 35 years    Types: Cigarettes  . Smokeless tobacco: Never Used  . Alcohol Use: 29.4 oz/week    49 Standard drinks or equivalent per week  . Drug Use: Yes    Special: Marijuana     Comment: LAST TIME TUESDAY OR WEDNESDAY  . Sexual Activity: Not on file   Other Topics Concern  . Not on file   Social History  Narrative    Review of Systems  Constitutional: Positive for fever.  HENT: Negative for congestion.   Respiratory: Negative for cough and shortness of breath.   Gastrointestinal: Positive for abdominal pain and diarrhea. Negative for nausea, vomiting, constipation and blood in stool.      Objective:   Physical Exam  Constitutional: She is oriented to person, place, and time. She appears well-developed and well-nourished. No distress.  HENT:  Head: Normocephalic and atraumatic.  Eyes: EOM are normal. Pupils are equal, round, and reactive to light.  Neck: Neck supple.  Cardiovascular: Regular rhythm.  Tachycardia present.   No murmur  heard. Pulmonary/Chest: Effort normal and breath sounds normal. No respiratory distress. She has no wheezes. She has no rales.  Abdominal:  Slightly decreased bowel sounds.  Diffused tenderness in lower abdominal including McBurney's, with slight guarding, no rebound.  Positive heel jar.  Neurological: She is alert and oriented to person, place, and time. No cranial nerve deficit.  Skin: Skin is warm and dry.  Psychiatric: She has a normal mood and affect. Her behavior is normal.  Nursing note and vitals reviewed.   Filed Vitals:   02/17/15 0930  BP: 132/70  Pulse: 112  Temp: 102 F (38.9 C)  TempSrc: Oral  Resp: 16  Height: 5\' 1"  (1.549 m)  Weight: 131 lb (59.421 kg)  SpO2: 98%      Assessment & Plan:  Ruth Perez is a 49 y.o. female Abdominal pain, lower - Plan: POCT UA - Microscopic Only, POCT urinalysis dipstick, CT Abdomen Pelvis W Contrast  H/O hematuria - Plan: POCT UA - Microscopic Only, POCT urinalysis dipstick  Fever, unknown origin - Plan: POCT CBC, CT Abdomen Pelvis W Contrast  Leukocytosis - Plan: CT Abdomen Pelvis W Contrast  Just over 1 day history of acute onset lower abdominal pain, fever, few episodes of alternating diarrhea and constipation yesterday. No known history of diverticulitis and colitis or diverticulosis and this was not seen on her most recent colonoscopy. Differential diagnosis includes appendicitis, and less likely pancreatitis based on location, but she does have a history of alcohol abuse. Will check CT scan this morning then further workup/plan to be determined.  No orders of the defined types were placed in this encounter.   Patient Instructions  We will check a ct scan this morning, then determine next step. Nothing to eat or drink for now in case there is something on your CT scan requiring surgery.       IMPRESSION: Inflammatory changes of the descending and sigmoid colon.  Sigmoid diverticulosis and evidence of  diverticulitis, with severe pericolonic inflammatory changes, and possible abscess formation adjacent to the distal sigmoid colon. No drainable fluid collection is seen. Although the appearance is relatively typical for diverticulosis, correlation to colonoscopy may be considered (after resolution of the acute problem), to exclude underlying malignancy.  Leiomyomatous uterus.  7 mm hypoattenuated pancreatic mass. Further evaluation with MRI of the abdomen may be considered.  Atherosclerotic disease of the aorta.   3:57 PM Results discussed with patient on the phone, will have her walk over to the emergency room for admission by the hospitalist. Paged hospitalist on call for admission given the possible abscess and the acuity of her symptoms today. Can have the pancreatic mass evaluated in hospital with MRI or outpatient if needed. May need to have a lipase drawn in hospital given this mass on MRI and her history of alcohol abuse.  Discussed with hospitalist on call for [facility] -  will evaluate pt in ER.    I personally performed the services described in this documentation, which was scribed in my presence. The recorded information has been reviewed and considered, and addended by me as needed.

## 2015-02-17 NOTE — ED Notes (Signed)
Bed: WA03 Expected date:  Expected time:  Means of arrival:  Comments: Hold diverticulitis from xray

## 2015-02-17 NOTE — ED Notes (Signed)
Main Lab called and lavender lab tube needs to be recollected.

## 2015-02-18 DIAGNOSIS — K869 Disease of pancreas, unspecified: Secondary | ICD-10-CM

## 2015-02-18 LAB — C DIFFICILE QUICK SCREEN W PCR REFLEX
C DIFFICILE (CDIFF) TOXIN: NEGATIVE
C Diff antigen: POSITIVE — AB

## 2015-02-18 LAB — CBC
HCT: 36.7 % (ref 36.0–46.0)
HEMOGLOBIN: 12.3 g/dL (ref 12.0–15.0)
MCH: 31.5 pg (ref 26.0–34.0)
MCHC: 33.5 g/dL (ref 30.0–36.0)
MCV: 94.1 fL (ref 78.0–100.0)
Platelets: 258 10*3/uL (ref 150–400)
RBC: 3.9 MIL/uL (ref 3.87–5.11)
RDW: 13.7 % (ref 11.5–15.5)
WBC: 13.3 10*3/uL — ABNORMAL HIGH (ref 4.0–10.5)

## 2015-02-18 LAB — BASIC METABOLIC PANEL
Anion gap: 8 (ref 5–15)
BUN: 5 mg/dL — AB (ref 6–20)
CALCIUM: 8.9 mg/dL (ref 8.9–10.3)
CO2: 25 mmol/L (ref 22–32)
CREATININE: 0.6 mg/dL (ref 0.44–1.00)
Chloride: 105 mmol/L (ref 101–111)
GLUCOSE: 130 mg/dL — AB (ref 65–99)
Potassium: 3.8 mmol/L (ref 3.5–5.1)
Sodium: 138 mmol/L (ref 135–145)

## 2015-02-18 MED ORDER — POTASSIUM CHLORIDE IN NACL 20-0.9 MEQ/L-% IV SOLN
INTRAVENOUS | Status: DC
  Administered 2015-02-18 – 2015-02-19 (×2): via INTRAVENOUS
  Filled 2015-02-18 (×3): qty 1000

## 2015-02-18 MED ORDER — MORPHINE SULFATE 2 MG/ML IJ SOLN
2.0000 mg | INTRAMUSCULAR | Status: DC | PRN
  Administered 2015-02-18 – 2015-02-19 (×3): 2 mg via INTRAVENOUS
  Filled 2015-02-18 (×4): qty 1

## 2015-02-18 MED ORDER — PIPERACILLIN-TAZOBACTAM 3.375 G IVPB
3.3750 g | Freq: Three times a day (TID) | INTRAVENOUS | Status: DC
  Administered 2015-02-18 – 2015-02-23 (×13): 3.375 g via INTRAVENOUS
  Filled 2015-02-18 (×15): qty 50

## 2015-02-18 MED ORDER — SODIUM CHLORIDE 0.9 % IV SOLN
INTRAVENOUS | Status: DC
  Administered 2015-02-18: 13:00:00 via INTRAVENOUS

## 2015-02-18 MED ORDER — FENTANYL CITRATE (PF) 100 MCG/2ML IJ SOLN
12.5000 ug | INTRAMUSCULAR | Status: DC | PRN
  Administered 2015-02-18: 12.5 ug via INTRAVENOUS
  Filled 2015-02-18: qty 2

## 2015-02-18 MED ORDER — FENTANYL CITRATE (PF) 100 MCG/2ML IJ SOLN
25.0000 ug | INTRAMUSCULAR | Status: DC | PRN
  Administered 2015-02-18: 25 ug via INTRAVENOUS
  Filled 2015-02-18: qty 2

## 2015-02-18 NOTE — Care Management Note (Signed)
Case Management Note  Patient Details  Name: Ruth Perez MRN: [id number] Date of Birth: Apr 11, 1966  Subjective/Objective:                 diverticulitis   Action/Plan: Date:  February 18, 2015 U.R. performed for needs and level of care. Will continue to follow for Case Management needs.  Ruth Harman, RN, BSN, [place]   [phone]  Expected Discharge Date:   (unknown)               Expected Discharge Plan:  Home/Self Care  In-House Referral:  NA  Discharge planning Services  CM Consult  Post Acute Care Choice:  NA Choice offered to:  NA  DME Arranged:    DME Agency:     HH Arranged:    HH Agency:     Status of Service:  In process, will continue to follow  Medicare Important Message Given:    Date Medicare IM Given:    Medicare IM give by:    Date Additional Medicare IM Given:    Additional Medicare Important Message give by:     If discussed at [place] of Stay Meetings, dates discussed:    Additional Comments:  Ruth Cha, RN 02/18/2015, 10:39 AM

## 2015-02-18 NOTE — Progress Notes (Signed)
[facility] PROGRESS NOTE  Ruth Perez YQI:[id number] DOB: 10/21/1965 DOA: 02/17/2015 PCP: Jenny Reichmann, MD  Assessment/Plan: Principal Problem:  SIRS (systemic inflammatory response syndrome) Secondary to acute diverticulitis with possible abscess formation to the distal sigmoid colon. (Non-drainable as per radiologist) -Having low-grade fever this morning. Abdominal pain slightly improved. -on empiric Cipro and Flagyl. Serial abdominal exam. -Appreciate surgery follow-up. Recommend medical management for now. -Pain control with when necessary low-dose fentanyl . Continue gentle IV fluid.  Tylenol for fever.   Active Problems:  Essential hypertension Blood pressure stable. Resume home medications   EtOH abuse Counseled on cessation. Drinks about 6 cans of beer every day.  Monitor on CIWA. Add thiamine, folate and multivitamin.   Smoker unmotivated to quit not interested in quitting. place on nicotine patch.    Pancreatic lesion , 26mm on CT scan  Incidental finding. Denies weight loss. Lipase and LFTs was normal. Patient will need MRI of the abdomen once current symptoms improved.  hypokalemia replenished with IV fluids.   Diet: Clear liquid  DVT prophylaxis: sq lovenox   Code Status: full code Family Communication: None at bedside Disposition Plan: Home once symptoms improved.  [name] [facility] Pager [phone]   Consultants:  CC[facility]  Procedures:  CT abdomen and pelvis  Antibiotics:  IV Cipro and Flagyl since 8/4  HPI/Subjective: Still has some bilateral lower quadrant abdominal pain but improved. No nausea or vomiting.  Objective: Filed Vitals:   02/18/15 0803  BP:   Pulse:   Temp: 100.1 F (37.8 C)  Resp:     Intake/Output Summary (Last 24 hours) at 02/18/15 1147 Last data filed at 02/18/15 0936  Gross per 24 hour  Intake 1843.33 ml  Output      0 ml  Net 1843.33 ml   Filed Weights   02/17/15 1813   Weight: 58.741 kg (129 lb 8 oz)    Exam:   General:  Middle aged female in no acute distress  HEENT: No pallor, moist oral mucosa  Chest: Clear to auscultation bilaterally  CVS: Normal S1 and S2  GI: Soft, nondistended, bowel sounds+, bilateral lower quadrant tenderness (improved)  Musculoskeletal: Warm, no edema   CNS: Alert and oriented, no tremors   Data Reviewed: Basic Metabolic Panel:  Recent Labs Lab 02/17/15 1624 02/18/15 0530  NA 133* 138  K 3.5 3.8  CL 97* 105  CO2 24 25  GLUCOSE 93 130*  BUN 8 5*  CREATININE 0.55 0.60  CALCIUM 9.1 8.9   Liver Function Tests:  Recent Labs Lab 02/17/15 1624  AST 38  ALT 28  ALKPHOS 66  BILITOT 1.1  PROT 7.7  ALBUMIN 4.0    Recent Labs Lab 02/17/15 1624  LIPASE 17*   No results for input(s): AMMONIA in the last 168 hours. CBC:  Recent Labs Lab 02/17/15 1033 02/17/15 1701 02/18/15 0530  WBC 16.6* 15.5* 13.3*  HGB 13.5 12.3 12.3  HCT 39.6 35.9* 36.7  MCV 91.1 93.2 94.1  PLT  --  256 258   Cardiac Enzymes: No results for input(s): CKTOTAL, CKMB, CKMBINDEX, TROPONINI in the last 168 hours. BNP (last 3 results) No results for input(s): BNP in the last 8760 hours.  ProBNP (last 3 results) No results for input(s): PROBNP in the last 8760 hours.  CBG: No results for input(s): GLUCAP in the last 168 hours.  No results found for this or any previous visit (from the past 240 hour(s)).   Studies: Ct Abdomen Pelvis W  Contrast  02/17/2015   CLINICAL DATA:  Lower pelvic pain for 1 day. Fever. Elevated white blood cell count.  EXAM: CT ABDOMEN AND PELVIS WITH CONTRAST  TECHNIQUE: Multidetector CT imaging of the abdomen and pelvis was performed using the standard protocol following bolus administration of intravenous contrast.  CONTRAST:  25 mL OMNIPAQUE IOHEXOL 300 MG/ML SOLN GIVEN ORALLY, 136mL OMNIPAQUE IOHEXOL 300 MG/ML SOLN GIVEN INTRAVENOUSLY.  COMPARISON:  CT of the abdomen and pelvis dated 05/05/2014   FINDINGS: Lower chest:  Unremarkable.  Hepatobiliary: No masses or other significant abnormality identified.  Pancreas: There is a 0.7 by 0.5 cm circumscribed hypoattenuated lesion within the body of the pancreas. The pancreas otherwise appears unremarkable.  Spleen:  Within normal limits in size and appearance.  Adrenal Glands: No masses identified. There is mild diffuse thickening of the left adrenal gland.  Kidneys/Urinary Tract: No evidence of urolithiasis or hydronephrosis. No solid or complex cystic renal masses identified. No masses or calculi seen involving the lower urinary tract.  Stomach/Bowel/Peritoneum: The appendix is normal. Stomach and small bowel are normal. There is diffuse circumferential wall thickening of the descending colon, and a even more so of the sigmoid colon. There are several sigmoid diverticula with significant pericolonic inflammatory changes adjacent to the lateral portion of the distal sigmoid colon where there is fat stranding, small amount of free fluid and a gas bubble which is likely extraluminal. (image 60 sequence two).  Vascular/Lymphatic: No pathologically enlarged lymph nodes identified. Heavy calcified atherosclerotic disease of the aorta and common iliac arteries is noted.  Reproductive: The uterus is enlarged and lobular appearing likely due to presence of fibroids. There is a partially calcified exophytic sub mucosal fibroid off of the right uterine fundus which measures 3.2 cm in diameter and a second apparently intramural fundal fibroid which measures 2.5 cm.  Other:  None.  Musculoskeletal: There is a lytic appearing focus within the right side of L3 vertebral body which measures 5 mm in diameter. This is a nonspecific finding, which in the absence of history of malignancy likely represents a benign finding.  IMPRESSION: Inflammatory changes of the descending and sigmoid colon.  Sigmoid diverticulosis and evidence of diverticulitis, with severe pericolonic  inflammatory changes, and possible abscess formation adjacent to the distal sigmoid colon. No drainable fluid collection is seen. Although the appearance is relatively typical for diverticulosis, correlation to colonoscopy may be considered (after resolution of the acute problem), to exclude underlying malignancy.  Leiomyomatous uterus.  7 mm hypoattenuated pancreatic mass. Further evaluation with MRI of the abdomen may be considered.  Atherosclerotic disease of the aorta.   Electronically Signed   By: Fidela Salisbury M.D.   On: 02/17/2015 15:01    Scheduled Meds: . ciprofloxacin  400 mg Intravenous Q12H  . enoxaparin (LOVENOX) injection  40 mg Subcutaneous Q24H  . folic acid  1 mg Oral Daily  . metronidazole  500 mg Intravenous Q8H  . multivitamin with minerals  1 tablet Oral Daily  . nicotine  21 mg Transdermal Daily  . ondansetron (ZOFRAN) IV  4 mg Intravenous Once  . thiamine  100 mg Oral Daily   Or  . thiamine  100 mg Intravenous Daily   Continuous Infusions: . dextrose 5 % and 0.9% NaCl 1,000 mL with potassium chloride 40 mEq infusion 100 mL/hr at 02/17/15 1856      Time spent:25 minutes    Jakeem Grape, [place]  [facility] Pager [phone]. If 7PM-7AM, please contact night-coverage at [email or web address], password [facility] 02/18/2015,  11:47 AM  LOS: 1 day

## 2015-02-18 NOTE — Progress Notes (Signed)
ANTIBIOTIC CONSULT NOTE  Pharmacy Consult for Zosyn Indication: intra-abdominal infection  Allergies  Allergen Reactions  . Codeine Nausea And Vomiting    Patient Measurements: Height: 5\' 1"  (154.9 cm) Weight: 129 lb 8 oz (58.741 kg) IBW/kg (Calculated) : 47.8  Vital Signs: Temp: 101.1 F (38.4 C) (08/05 1350) Temp Source: Oral (08/05 1206) BP: 111/75 mmHg (08/05 1206) Pulse Rate: 100 (08/05 1206) Intake/Output from previous day: 08/04 0701 - 08/05 0700 In: 1843.3 [I.V.:1143.3; IV Piggyback:700] Out: -  Intake/Output from this shift:    Labs:  Recent Labs  02/17/15 1033 02/17/15 1624 02/17/15 1701 02/18/15 0530  WBC 16.6*  --  15.5* 13.3*  HGB 13.5  --  12.3 12.3  PLT  --   --  256 258  CREATININE  --  0.55  --  0.60   Estimated Creatinine Clearance: 68.6 mL/min (by C-G formula based on Cr of 0.6). No results for input(s): VANCOTROUGH, VANCOPEAK, VANCORANDOM, GENTTROUGH, GENTPEAK, GENTRANDOM, TOBRATROUGH, TOBRAPEAK, TOBRARND, AMIKACINPEAK, AMIKACINTROU, AMIKACIN in the last 72 hours.   Microbiology: No results found for this or any previous visit (from the past 720 hour(s)).  Anti-infectives    Start     Dose/Rate Route Frequency Ordered Stop   02/18/15 2200  piperacillin-tazobactam (ZOSYN) IVPB 3.375 g     3.375 g 12.5 mL/hr over 240 Minutes Intravenous 3 times per day 02/18/15 1801     02/18/15 0600  ciprofloxacin (CIPRO) IVPB 400 mg  Status:  Discontinued     400 mg 200 mL/hr over 60 Minutes Intravenous Every 12 hours 02/17/15 1808 02/18/15 1801   02/18/15 0600  metroNIDAZOLE (FLAGYL) IVPB 500 mg  Status:  Discontinued     500 mg 100 mL/hr over 60 Minutes Intravenous Every 8 hours 02/17/15 1808 02/18/15 1801   02/17/15 1630  metroNIDAZOLE (FLAGYL) IVPB 500 mg     500 mg 100 mL/hr over 60 Minutes Intravenous  Once 02/17/15 1622 02/17/15 2151   02/17/15 1630  ciprofloxacin (CIPRO) IVPB 400 mg     400 mg 200 mL/hr over 60 Minutes Intravenous  Once  02/17/15 1622 02/17/15 1816     Assessment: 49 yo F admitted yesterday with complicated diverticulitis.  She was started on Cipro/Flagyl however continues to spike fever & have abdominal tenderness.  Surgeon requests broadened antibiotic coverage to Zosyn.   She has excellent renal function.    Goal of Therapy:  Eradicate infection.  Plan:  Zosyn 3.375gm IV Q8h to be infused over 4hrs Monitor renal function and cx data  Pharmacy to sign off.  Will follow at a distance.   Biagio Borg 02/18/2015,6:01 PM

## 2015-02-18 NOTE — Progress Notes (Signed)
Subjective: She is very tender and feels warm.   Objective: Vital signs in last 24 hours: Temp:  [98.6 F (37 C)-101.3 F (38.5 C)] 98.6 F (37 C) (08/05 1206) Pulse Rate:  [92-106] 100 (08/05 1206) Resp:  [18-19] 18 (08/05 1206) BP: (110-126)/(64-77) 111/75 mmHg (08/05 1206) SpO2:  [97 %-100 %] 100 % (08/05 1206) Weight:  [58.741 kg (129 lb 8 oz)] 58.741 kg (129 lb 8 oz) (08/04 1813) Last BM Date: 02/17/15 Diet: clears Low grade temp 100.1 at 0700, up to 101.3 last PM BMP OK CBC trending down Intake/Output from previous day: 08/04 0701 - 08/05 0700 In: 1843.3 [I.V.:1143.3; IV Piggyback:700] Out: -  Intake/Output this shift:    General appearance: alert, cooperative, no distress and Feels warm/low grade fever GI: soft, very tender whole abdomen.  Lab Results:   Recent Labs  02/17/15 1701 02/18/15 0530  WBC 15.5* 13.3*  HGB 12.3 12.3  HCT 35.9* 36.7  PLT 256 258    BMET  Recent Labs  02/17/15 1624 02/18/15 0530  NA 133* 138  K 3.5 3.8  CL 97* 105  CO2 24 25  GLUCOSE 93 130*  BUN 8 5*  CREATININE 0.55 0.60  CALCIUM 9.1 8.9   PT/INR No results for input(s): LABPROT, INR in the last 72 hours.   Recent Labs Lab 02/17/15 1624  AST 38  ALT 28  ALKPHOS 66  BILITOT 1.1  PROT 7.7  ALBUMIN 4.0     Lipase     Component Value Date/Time   LIPASE 17* 02/17/2015 1624     Studies/Results: Ct Abdomen Pelvis W Contrast  02/17/2015   CLINICAL DATA:  Lower pelvic pain for 1 day. Fever. Elevated white blood cell count.  EXAM: CT ABDOMEN AND PELVIS WITH CONTRAST  TECHNIQUE: Multidetector CT imaging of the abdomen and pelvis was performed using the standard protocol following bolus administration of intravenous contrast.  CONTRAST:  25 mL OMNIPAQUE IOHEXOL 300 MG/ML SOLN GIVEN ORALLY, 155mL OMNIPAQUE IOHEXOL 300 MG/ML SOLN GIVEN INTRAVENOUSLY.  COMPARISON:  CT of the abdomen and pelvis dated 05/05/2014  FINDINGS: Lower chest:  Unremarkable.  Hepatobiliary: No  masses or other significant abnormality identified.  Pancreas: There is a 0.7 by 0.5 cm circumscribed hypoattenuated lesion within the body of the pancreas. The pancreas otherwise appears unremarkable.  Spleen:  Within normal limits in size and appearance.  Adrenal Glands: No masses identified. There is mild diffuse thickening of the left adrenal gland.  Kidneys/Urinary Tract: No evidence of urolithiasis or hydronephrosis. No solid or complex cystic renal masses identified. No masses or calculi seen involving the lower urinary tract.  Stomach/Bowel/Peritoneum: The appendix is normal. Stomach and small bowel are normal. There is diffuse circumferential wall thickening of the descending colon, and a even more so of the sigmoid colon. There are several sigmoid diverticula with significant pericolonic inflammatory changes adjacent to the lateral portion of the distal sigmoid colon where there is fat stranding, small amount of free fluid and a gas bubble which is likely extraluminal. (image 60 sequence two).  Vascular/Lymphatic: No pathologically enlarged lymph nodes identified. Heavy calcified atherosclerotic disease of the aorta and common iliac arteries is noted.  Reproductive: The uterus is enlarged and lobular appearing likely due to presence of fibroids. There is a partially calcified exophytic sub mucosal fibroid off of the right uterine fundus which measures 3.2 cm in diameter and a second apparently intramural fundal fibroid which measures 2.5 cm.  Other:  None.  Musculoskeletal: There is a lytic  appearing focus within the right side of L3 vertebral body which measures 5 mm in diameter. This is a nonspecific finding, which in the absence of history of malignancy likely represents a benign finding.  IMPRESSION: Inflammatory changes of the descending and sigmoid colon.  Sigmoid diverticulosis and evidence of diverticulitis, with severe pericolonic inflammatory changes, and possible abscess formation adjacent to  the distal sigmoid colon. No drainable fluid collection is seen. Although the appearance is relatively typical for diverticulosis, correlation to colonoscopy may be considered (after resolution of the acute problem), to exclude underlying malignancy.  Leiomyomatous uterus.  7 mm hypoattenuated pancreatic mass. Further evaluation with MRI of the abdomen may be considered.  Atherosclerotic disease of the aorta.   Electronically Signed   By: Fidela Salisbury M.D.   On: 02/17/2015 15:01    Medications: . ciprofloxacin  400 mg Intravenous Q12H  . enoxaparin (LOVENOX) injection  40 mg Subcutaneous Q24H  . folic acid  1 mg Oral Daily  . metronidazole  500 mg Intravenous Q8H  . multivitamin with minerals  1 tablet Oral Daily  . nicotine  21 mg Transdermal Daily  . ondansetron (ZOFRAN) IV  4 mg Intravenous Once  . thiamine  100 mg Oral Daily   Or  . thiamine  100 mg Intravenous Daily   . sodium chloride 75 mL/hr at 02/18/15 1304    Assessment/Plan 1.  Diverticulitis descending and sigmoid colon 2. HTN 3. Hyperlipidemia 4. Smokes cigarettes  She needs to quit, but it does not sound like she is very inclined to. 5. History of alcohol abuse. 7. Pancreatic lesion seen on CT  7 mm 8. Hematuria - seen by Dr. Diona Fanti, but eval negative. 9.  Antibiotics:  Day 1.5 Cipro/Flagyl 10.  DVT:  Lovenox/SCD    Plan:  I told her to stick to ice chips and sips for now.  Continue antibiotics. I will increase her fluids also.  LOS: 1 day    Irven Ingalsbe 02/18/2015

## 2015-02-19 ENCOUNTER — Inpatient Hospital Stay (HOSPITAL_COMMUNITY): Payer: 59

## 2015-02-19 DIAGNOSIS — A419 Sepsis, unspecified organism: Principal | ICD-10-CM

## 2015-02-19 LAB — CBC
HCT: 33.8 % — ABNORMAL LOW (ref 36.0–46.0)
Hemoglobin: 11.1 g/dL — ABNORMAL LOW (ref 12.0–15.0)
MCH: 30.7 pg (ref 26.0–34.0)
MCHC: 32.8 g/dL (ref 30.0–36.0)
MCV: 93.6 fL (ref 78.0–100.0)
Platelets: 235 10*3/uL (ref 150–400)
RBC: 3.61 MIL/uL — AB (ref 3.87–5.11)
RDW: 13.6 % (ref 11.5–15.5)
WBC: 13.2 10*3/uL — ABNORMAL HIGH (ref 4.0–10.5)

## 2015-02-19 MED ORDER — IOHEXOL 300 MG/ML  SOLN
100.0000 mL | Freq: Once | INTRAMUSCULAR | Status: AC | PRN
  Administered 2015-02-19: 100 mL via INTRAVENOUS

## 2015-02-19 MED ORDER — KCL IN DEXTROSE-NACL 20-5-0.45 MEQ/L-%-% IV SOLN
INTRAVENOUS | Status: DC
  Administered 2015-02-19 – 2015-02-23 (×6): via INTRAVENOUS
  Filled 2015-02-19 (×11): qty 1000

## 2015-02-19 NOTE — Progress Notes (Signed)
Subjective: Still with pain, less flatus  Objective: Vital signs in last 24 hours: Temp:  [98.6 F (37 C)-101.1 F (38.4 C)] 99.8 F (37.7 C) (08/06 0510) Pulse Rate:  [100-118] 106 (08/06 0510) Resp:  [18] 18 (08/06 0510) BP: (111-129)/(73-89) 114/76 mmHg (08/06 0510) SpO2:  [97 %-100 %] 98 % (08/06 0510) Last BM Date: 02/17/15  Intake/Output from previous day: 08/05 0701 - 08/06 0700 In: 1560 [I.V.:1460; IV Piggyback:100] Out: -  Intake/Output this shift:    GI: tender llq>rlq some bs  Lab Results:   Recent Labs  02/18/15 0530 02/19/15 0531  WBC 13.3* 13.2*  HGB 12.3 11.1*  HCT 36.7 33.8*  PLT 258 235   BMET  Recent Labs  02/17/15 1624 02/18/15 0530  NA 133* 138  K 3.5 3.8  CL 97* 105  CO2 24 25  GLUCOSE 93 130*  BUN 8 5*  CREATININE 0.55 0.60  CALCIUM 9.1 8.9   PT/INR No results for input(s): LABPROT, INR in the last 72 hours. ABG No results for input(s): PHART, HCO3 in the last 72 hours.  Invalid input(s): PCO2, PO2  Studies/Results: Ct Abdomen Pelvis W Contrast  02/17/2015   CLINICAL DATA:  Lower pelvic pain for 1 day. Fever. Elevated white blood cell count.  EXAM: CT ABDOMEN AND PELVIS WITH CONTRAST  TECHNIQUE: Multidetector CT imaging of the abdomen and pelvis was performed using the standard protocol following bolus administration of intravenous contrast.  CONTRAST:  25 mL OMNIPAQUE IOHEXOL 300 MG/ML SOLN GIVEN ORALLY, 154mL OMNIPAQUE IOHEXOL 300 MG/ML SOLN GIVEN INTRAVENOUSLY.  COMPARISON:  CT of the abdomen and pelvis dated 05/05/2014  FINDINGS: Lower chest:  Unremarkable.  Hepatobiliary: No masses or other significant abnormality identified.  Pancreas: There is a 0.7 by 0.5 cm circumscribed hypoattenuated lesion within the body of the pancreas. The pancreas otherwise appears unremarkable.  Spleen:  Within normal limits in size and appearance.  Adrenal Glands: No masses identified. There is mild diffuse thickening of the left adrenal gland.   Kidneys/Urinary Tract: No evidence of urolithiasis or hydronephrosis. No solid or complex cystic renal masses identified. No masses or calculi seen involving the lower urinary tract.  Stomach/Bowel/Peritoneum: The appendix is normal. Stomach and small bowel are normal. There is diffuse circumferential wall thickening of the descending colon, and a even more so of the sigmoid colon. There are several sigmoid diverticula with significant pericolonic inflammatory changes adjacent to the lateral portion of the distal sigmoid colon where there is fat stranding, small amount of free fluid and a gas bubble which is likely extraluminal. (image 60 sequence two).  Vascular/Lymphatic: No pathologically enlarged lymph nodes identified. Heavy calcified atherosclerotic disease of the aorta and common iliac arteries is noted.  Reproductive: The uterus is enlarged and lobular appearing likely due to presence of fibroids. There is a partially calcified exophytic sub mucosal fibroid off of the right uterine fundus which measures 3.2 cm in diameter and a second apparently intramural fundal fibroid which measures 2.5 cm.  Other:  None.  Musculoskeletal: There is a lytic appearing focus within the right side of L3 vertebral body which measures 5 mm in diameter. This is a nonspecific finding, which in the absence of history of malignancy likely represents a benign finding.  IMPRESSION: Inflammatory changes of the descending and sigmoid colon.  Sigmoid diverticulosis and evidence of diverticulitis, with severe pericolonic inflammatory changes, and possible abscess formation adjacent to the distal sigmoid colon. No drainable fluid collection is seen. Although the appearance is relatively typical  for diverticulosis, correlation to colonoscopy may be considered (after resolution of the acute problem), to exclude underlying malignancy.  Leiomyomatous uterus.  7 mm hypoattenuated pancreatic mass. Further evaluation with MRI of the abdomen may  be considered.  Atherosclerotic disease of the aorta.   Electronically Signed   By: Fidela Salisbury M.D.   On: 02/17/2015 15:01    Anti-infectives: Anti-infectives    Start     Dose/Rate Route Frequency Ordered Stop   02/18/15 2200  piperacillin-tazobactam (ZOSYN) IVPB 3.375 g     3.375 g 12.5 mL/hr over 240 Minutes Intravenous 3 times per day 02/18/15 1801     02/18/15 0600  ciprofloxacin (CIPRO) IVPB 400 mg  Status:  Discontinued     400 mg 200 mL/hr over 60 Minutes Intravenous Every 12 hours 02/17/15 1808 02/18/15 1801   02/18/15 0600  metroNIDAZOLE (FLAGYL) IVPB 500 mg  Status:  Discontinued     500 mg 100 mL/hr over 60 Minutes Intravenous Every 8 hours 02/17/15 1808 02/18/15 1801   02/17/15 1630  metroNIDAZOLE (FLAGYL) IVPB 500 mg     500 mg 100 mL/hr over 60 Minutes Intravenous  Once 02/17/15 1622 02/17/15 2151   02/17/15 1630  ciprofloxacin (CIPRO) IVPB 400 mg     400 mg 200 mL/hr over 60 Minutes Intravenous  Once 02/17/15 1622 02/17/15 1816      Assessment/Plan: Complicated diverticulitis  Still tender, low grade fever, wbc unchanged Will rescan today to see if abscess has formed although scan 2 days ago I am concerned about her exam  [facility] 02/19/2015

## 2015-02-19 NOTE — Progress Notes (Signed)
[facility] PROGRESS NOTE  Liesa Tsan Delany ZOX:[id number] DOB: 1966/04/28 DOA: 02/17/2015 PCP: Jenny Reichmann, MD  Assessment/Plan:   Sepsis secondary to diverticulitis Secondary to acute diverticulitis with perforation of distal sigmoid colon.  -IV antibiotics was changed from Cipro and Flagyl to Zosyn on 8/5 giving patient significant fever, and pain. -Patient still nothing by mouth per surgical recommendation. - Repeat CT abdomen and pelvis today showing no significant change, only microperforation with no evidence of abscess -Appreciate surgery follow-up. Recommend medical management for now. -10 units with when necessary pain and nausea medication.   Diarrhea - Possibly related to acute diverticulitis, but her C. difficile test is nonconclusive, as negative for toxin and positive for antigen, discussed with ID, will check C. difficile by PCR to see if treatment is indicated.   Essential hypertension Blood pressure stable. 2 new to hold home medications   EtOH abuse Counseled on cessation. Drinks about 6 cans of beer every day.  Monitor on CIWA. on thiamine, folate and multivitamin.   Smoker unmotivated to quit not interested in quitting. place on nicotine patch.    Pancreatic lesion , 71mm on CT scan  Incidental finding. Denies weight loss. Lipase and LFTs was normal. Patient will need MRI of the abdomen once current symptoms improved.  hypokalemia replenished with IV fluids.   Diet: An by mouth  DVT prophylaxis: sq lovenox   Code Status: full code Family Communication: None at bedside Disposition Plan: Home once symptoms improved.  [name] [facility] Pager [phone]   Consultants:  CCS  Procedures:  CT abdomen and pelvis  Antibiotics:  IV Cipro and Flagyl since 8/4>>8/5  IV Zosyn 8/5  HPI/Subjective: Still has some bilateral lower quadrant abdominal pain but improved. No nausea or vomiting. reports  diarrhea  Objective: Filed Vitals:   02/19/15 1400  BP: 137/90  Pulse: 102  Temp: 100.1 F (37.8 C)  Resp: 18    Intake/Output Summary (Last 24 hours) at 02/19/15 1615 Last data filed at 02/19/15 1400  Gross per 24 hour  Intake 2336.67 ml  Output      0 ml  Net 2336.67 ml   Filed Weights   02/17/15 1813  Weight: 58.741 kg (129 lb 8 oz)    Exam:   General:  Middle aged female in no acute distress  HEENT: No pallor, moist oral mucosa  Chest: Clear to auscultation bilaterally  CVS: Normal S1 and S2  GI: Soft, nondistended, bowel sounds+, bilateral lower quadrant tenderness   Musculoskeletal: Warm, no edema   CNS: Alert and oriented, no tremors   Data Reviewed: Basic Metabolic Panel:  Recent Labs Lab 02/17/15 1624 02/18/15 0530  NA 133* 138  K 3.5 3.8  CL 97* 105  CO2 24 25  GLUCOSE 93 130*  BUN 8 5*  CREATININE 0.55 0.60  CALCIUM 9.1 8.9   Liver Function Tests:  Recent Labs Lab 02/17/15 1624  AST 38  ALT 28  ALKPHOS 66  BILITOT 1.1  PROT 7.7  ALBUMIN 4.0    Recent Labs Lab 02/17/15 1624  LIPASE 17*   No results for input(s): AMMONIA in the last 168 hours. CBC:  Recent Labs Lab 02/17/15 1033 02/17/15 1701 02/18/15 0530 02/19/15 0531  WBC 16.6* 15.5* 13.3* 13.2*  HGB 13.5 12.3 12.3 11.1*  HCT 39.6 35.9* 36.7 33.8*  MCV 91.1 93.2 94.1 93.6  PLT  --  256 258 235   Cardiac Enzymes: No results for input(s): CKTOTAL, CKMB, CKMBINDEX, TROPONINI in the last  168 hours. BNP (last 3 results) No results for input(s): BNP in the last 8760 hours.  ProBNP (last 3 results) No results for input(s): PROBNP in the last 8760 hours.  CBG: No results for input(s): GLUCAP in the last 168 hours.  Recent Results (from the past 240 hour(s))  C difficile quick scan w PCR reflex     Status: Abnormal   Collection Time: 02/18/15  9:18 PM  Result Value Ref Range Status   C Diff antigen POSITIVE (A) NEGATIVE Final   C Diff toxin NEGATIVE NEGATIVE  Final   C Diff interpretation   Final    C. difficile present, but toxin not detected. This indicates colonization. In most cases, this does not require treatment. If patient has signs and symptoms consistent with colitis, consider treatment.     Studies: Ct Abdomen Pelvis W Contrast  02/19/2015   CLINICAL DATA:  Patient with diverticulosis c/o bilat lower quadrant pain, fever 101, WBC: 34,200Prior scan 2 days ago. Concerned for abscess  EXAM: CT ABDOMEN AND PELVIS WITH CONTRAST  TECHNIQUE: Multidetector CT imaging of the abdomen and pelvis was performed using the standard protocol following bolus administration of intravenous contrast.  CONTRAST:  139mL OMNIPAQUE IOHEXOL 300 MG/ML  SOLN  COMPARISON:  02/17/2015.  FINDINGS: Gastrointestinal: As noted on prior CT, there is wall thickening of the mid to distal sigmoid colon where there are multiple diverticula. Along the posterior margin of the affected segment of sigmoid colon, there is inflammatory fat stranding and a small amount of extraluminal contrast and air consistent with a localized perforation. However, there is no evidence of an abscess and there is no free air. Overall, there has been no significant worsening of disease, but no improvement.  There are no other areas of colonic inflammation. There is mild prominence of the proximal small bowel with air-fluid levels suggesting a mild diffuse adynamic ileus. Small bowel is otherwise unremarkable. Normal appendix is visualized.  Lung bases:  Clear.  Heart normal size.  Liver: Small area of focal fat accumulation adjacent to the falciform ligament. Otherwise normal.  Spleen, gallbladder, pancreas, adrenal glands:  Unremarkable.  Kidneys, ureters, bladder:  Unremarkable.  Uterus and adnexa: 2.7 cm partly calcified right uterine fibroid. Uterus normal in overall size. No adnexal masses.  Scattered mildly prominent retroperitoneal lymph nodes. No enlarged lymph nodes.  Ascites:  None.  Musculoskeletal: Disc  degenerative changes at L5-S1. No osteoblastic or osteolytic lesions.  IMPRESSION: 1. Sigmoid colon diverticulitis is without significant change from the recent prior study. 2. There is a localized perforation with some extraluminal air, but no evidence of an abscess and no free air. 3. No other acute findings.   Electronically Signed   By: Lajean Manes M.D.   On: 02/19/2015 14:47    Scheduled Meds: . enoxaparin (LOVENOX) injection  40 mg Subcutaneous Q24H  . folic acid  1 mg Oral Daily  . multivitamin with minerals  1 tablet Oral Daily  . nicotine  21 mg Transdermal Daily  . ondansetron (ZOFRAN) IV  4 mg Intravenous Once  . piperacillin-tazobactam (ZOSYN)  IV  3.375 g Intravenous 3 times per day  . thiamine  100 mg Oral Daily   Or  . thiamine  100 mg Intravenous Daily   Continuous Infusions: . dextrose 5 % and 0.45 % NaCl with KCl 20 mEq/L 100 mL/hr at 02/19/15 1014      Time spent:25 minutes    ELGERGAWY, DAWOOD  [facility] Pager [phone]. If 7PM-7AM, please  contact night-coverage at [email or web address], password [facility] 02/19/2015, 4:15 PM  LOS: 2 days

## 2015-02-19 NOTE — Progress Notes (Signed)
CHAMP/antibiotic stewardship note  Patient's clinical presentation is likely due to diverticulitis. Stool was tested by CDIFF antigen/toxin (which was antigen +/toxin - ) then reflexed to PCR which was negative. Thus, she is colonized with a non-toxigenic form of c.difficile.  Recommend = not to treat for c.difficile. Continue with piptazo for severe colitist/diverticulitis  If questions, please call ID consultation  Soul Hackman B[facility] [place] [facility] [phone]

## 2015-02-20 LAB — CBC
HEMATOCRIT: 30.7 % — AB (ref 36.0–46.0)
Hemoglobin: 10.4 g/dL — ABNORMAL LOW (ref 12.0–15.0)
MCH: 31.3 pg (ref 26.0–34.0)
MCHC: 33.9 g/dL (ref 30.0–36.0)
MCV: 92.5 fL (ref 78.0–100.0)
PLATELETS: 230 10*3/uL (ref 150–400)
RBC: 3.32 MIL/uL — AB (ref 3.87–5.11)
RDW: 13.5 % (ref 11.5–15.5)
WBC: 14.2 10*3/uL — AB (ref 4.0–10.5)

## 2015-02-20 LAB — C DIFFICILE QUICK SCREEN W PCR REFLEX
C DIFFICILE (CDIFF) TOXIN: NEGATIVE
C DIFFICLE (CDIFF) ANTIGEN: NEGATIVE
C Diff interpretation: NEGATIVE

## 2015-02-20 MED ORDER — MORPHINE SULFATE 4 MG/ML IJ SOLN: 4.0000 mg | INTRAMUSCULAR | Status: DC | PRN

## 2015-02-20 MED ORDER — KETOROLAC TROMETHAMINE 15 MG/ML IJ SOLN
15.0000 mg | Freq: Once | INTRAMUSCULAR | Status: AC
  Administered 2015-02-20: 15 mg via INTRAVENOUS
  Filled 2015-02-20: qty 1

## 2015-02-20 NOTE — ED Provider Notes (Signed)
CSN: [id number]     Arrival date & time 02/17/15  1544 History   First MD Initiated Contact with Patient 02/17/15 1554     Chief Complaint  Patient presents with  . Diverticulitis     HPI  Patient presents for evaluation of abdominal pain for the last 36 hours. Seen by primary care physician and outpatient CT obtained showing diverticulitis with microperforation. Complains of abdominal pain left lower quadrant nausea and fever shakes and chills.  Past Medical History  Diagnosis Date  . Hypertension   . Hyperlipidemia   . Arthritis   . Alcohol abuse    Past Surgical History  Procedure Laterality Date  . Tonsillectomy    . Mouth surgery     Family History  Problem Relation Age of Onset  . Heart disease Mother   . Kidney disease Father   . Colon polyps Father   . Heart disease Father   . Aneurysm Father     AAA  . Neuropathy Father   . Thyroid cancer Mother    History  Substance Use Topics  . Smoking status: Current Every Day Smoker -- 1.00 packs/day for 35 years    Types: Cigarettes  . Smokeless tobacco: Never Used  . Alcohol Use: 29.4 oz/week    49 Standard drinks or equivalent per week   OB History    No data available     Review of Systems  Constitutional: Positive for fever, chills and fatigue. Negative for diaphoresis and appetite change.  HENT: Negative for mouth sores, sore throat and trouble swallowing.   Eyes: Negative for visual disturbance.  Respiratory: Negative for cough, chest tightness, shortness of breath and wheezing.   Cardiovascular: Negative for chest pain.  Gastrointestinal: Positive for nausea and abdominal pain. Negative for vomiting, diarrhea and abdominal distention.  Endocrine: Negative for polydipsia, polyphagia and polyuria.  Genitourinary: Negative for dysuria, frequency and hematuria.  Musculoskeletal: Negative for gait problem.  Skin: Negative for color change, pallor and rash.  Neurological: Negative for dizziness, syncope,  light-headedness and headaches.  Hematological: Does not bruise/bleed easily.  Psychiatric/Behavioral: Negative for behavioral problems and confusion.      Allergies  Codeine  Home Medications   Prior to Admission medications   Medication Sig Start Date End Date Taking? Authorizing Provider  lisinopril-hydrochlorothiazide (PRINZIDE,ZESTORETIC) 10-12.5 MG per tablet Take 1 tablet by mouth daily. 08/31/14  Yes Darlyne Russian, MD   BP 131/89 mmHg  Pulse 84  Temp(Src) 98.5 F (36.9 C) (Oral)  Resp 18  Ht 5\' 1"  (1.549 m)  Wt 129 lb 8 oz (58.741 kg)  BMI 24.48 kg/m2  SpO2 100%  LMP 02/18/2013 Physical Exam  Constitutional: She is oriented to person, place, and time. She appears well-developed and well-nourished. No distress.  HENT:  Head: Normocephalic.  Eyes: Conjunctivae are normal. Pupils are equal, round, and reactive to light. No scleral icterus.  Neck: Normal range of motion. Neck supple. No thyromegaly present.  Cardiovascular: Normal rate and regular rhythm.  Exam reveals no gallop and no friction rub.   No murmur heard. Pulmonary/Chest: Effort normal and breath sounds normal. No respiratory distress. She has no wheezes. She has no rales.  Abdominal: Soft. Bowel sounds are normal. She exhibits no distension. There is no tenderness. There is no rebound.    Tenderness without peritoneal irritation. Normal active bowel sounds.  Musculoskeletal: Normal range of motion.  Neurological: She is alert and oriented to person, place, and time.  Skin: Skin is warm and dry.  No rash noted.  Psychiatric: She has a normal mood and affect. Her behavior is normal.    ED Course  Procedures (including critical care time) Labs Review Labs Reviewed  C DIFFICILE QUICK SCAN W PCR REFLEX - Abnormal; Notable for the following:    C Diff antigen POSITIVE (*)    All other components within normal limits  LIPASE, BLOOD - Abnormal; Notable for the following:    Lipase 17 (*)    All other  components within normal limits  COMPREHENSIVE METABOLIC PANEL - Abnormal; Notable for the following:    Sodium 133 (*)    Chloride 97 (*)    All other components within normal limits  URINALYSIS, ROUTINE W REFLEX MICROSCOPIC (NOT AT [facility]) - Abnormal; Notable for the following:    Specific Gravity, Urine >1.046 (*)    Hgb urine dipstick MODERATE (*)    All other components within normal limits  CBC - Abnormal; Notable for the following:    WBC 15.5 (*)    RBC 3.85 (*)    HCT 35.9 (*)    All other components within normal limits  CBC - Abnormal; Notable for the following:    WBC 13.3 (*)    All other components within normal limits  BASIC METABOLIC PANEL - Abnormal; Notable for the following:    Glucose, Bld 130 (*)    BUN 5 (*)    All other components within normal limits  CBC - Abnormal; Notable for the following:    WBC 13.2 (*)    RBC 3.61 (*)    Hemoglobin 11.1 (*)    HCT 33.8 (*)    All other components within normal limits  CBC - Abnormal; Notable for the following:    WBC 14.2 (*)    RBC 3.32 (*)    Hemoglobin 10.4 (*)    HCT 30.7 (*)    All other components within normal limits  C DIFFICILE QUICK SCAN W PCR REFLEX  URINE MICROSCOPIC-ADD ON  LACTIC ACID, PLASMA  LACTIC ACID, PLASMA  CBC  BASIC METABOLIC PANEL    Imaging Review Ct Abdomen Pelvis W Contrast  02/19/2015   CLINICAL DATA:  Patient with diverticulosis c/o bilat lower quadrant pain, fever 101, WBC: 34,200Prior scan 2 days ago. Concerned for abscess  EXAM: CT ABDOMEN AND PELVIS WITH CONTRAST  TECHNIQUE: Multidetector CT imaging of the abdomen and pelvis was performed using the standard protocol following bolus administration of intravenous contrast.  CONTRAST:  175mL OMNIPAQUE IOHEXOL 300 MG/ML  SOLN  COMPARISON:  02/17/2015.  FINDINGS: Gastrointestinal: As noted on prior CT, there is wall thickening of the mid to distal sigmoid colon where there are multiple diverticula. Along the posterior margin of the  affected segment of sigmoid colon, there is inflammatory fat stranding and a small amount of extraluminal contrast and air consistent with a localized perforation. However, there is no evidence of an abscess and there is no free air. Overall, there has been no significant worsening of disease, but no improvement.  There are no other areas of colonic inflammation. There is mild prominence of the proximal small bowel with air-fluid levels suggesting a mild diffuse adynamic ileus. Small bowel is otherwise unremarkable. Normal appendix is visualized.  Lung bases:  Clear.  Heart normal size.  Liver: Small area of focal fat accumulation adjacent to the falciform ligament. Otherwise normal.  Spleen, gallbladder, pancreas, adrenal glands:  Unremarkable.  Kidneys, ureters, bladder:  Unremarkable.  Uterus and adnexa: 2.7 cm partly calcified right  uterine fibroid. Uterus normal in overall size. No adnexal masses.  Scattered mildly prominent retroperitoneal lymph nodes. No enlarged lymph nodes.  Ascites:  None.  Musculoskeletal: Disc degenerative changes at L5-S1. No osteoblastic or osteolytic lesions.  IMPRESSION: 1. Sigmoid colon diverticulitis is without significant change from the recent prior study. 2. There is a localized perforation with some extraluminal air, but no evidence of an abscess and no free air. 3. No other acute findings.   Electronically Signed   By: Lajean Manes M.D.   On: 02/19/2015 14:47     EKG Interpretation   Date/Time:  Thursday February 17 2015 17:49:47 EDT Ventricular Rate:  89 PR Interval:  163 QRS Duration: 92 QT Interval:  366 QTC Calculation: 445 R Axis:   73 Text Interpretation:  Sinus rhythm Biatrial enlargement Minimal ST  depression, lateral leads ED PHYSICIAN INTERPRETATION AVAILABLE IN [facility] Confirmed by TEST, Record ([id number]) on 02/18/2015 7:33:07 AM Also  confirmed by TEST, Record ([id number])  on 02/18/2015 7:33:55 AM      MDM   Final diagnoses:  Diverticulitis     Patient seen and evaluated. Studies reviewed. Care discussed with hospitalist. I also discussed the case with general surgery. Patient will be evaluated by general surgery admitted to medicine.    Tanna Furry, MD 02/20/15 2140

## 2015-02-20 NOTE — Progress Notes (Signed)
Subjective: Has headache this am bringing her to tears. Stomach feels "much better",having some flatus and stool, no n/v  Objective: Vital signs in last 24 hours: Temp:  [98.9 F (37.2 C)-100.1 F (37.8 C)] 99.7 F (37.6 C) (08/07 0603) Pulse Rate:  [96-102] 97 (08/07 0603) Resp:  [18-20] 20 (08/07 0603) BP: (102-137)/(54-90) 119/85 mmHg (08/07 0603) SpO2:  [96 %-100 %] 97 % (08/07 0603) Last BM Date: 02/19/15  Intake/Output from previous day: 08/06 0701 - 08/07 0700 In: 1784.9 [I.V.:1784.9] Out: -  Intake/Output this shift:    GI: soft minimally tender today, bs present  Lab Results:   Recent Labs  02/19/15 0531 02/20/15 0017  WBC 13.2* 14.2*  HGB 11.1* 10.4*  HCT 33.8* 30.7*  PLT 235 230   BMET  Recent Labs  02/17/15 1624 02/18/15 0530  NA 133* 138  K 3.5 3.8  CL 97* 105  CO2 24 25  GLUCOSE 93 130*  BUN 8 5*  CREATININE 0.55 0.60  CALCIUM 9.1 8.9   PT/INR No results for input(s): LABPROT, INR in the last 72 hours. ABG No results for input(s): PHART, HCO3 in the last 72 hours.  Invalid input(s): PCO2, PO2  Studies/Results: Ct Abdomen Pelvis W Contrast  02/19/2015   CLINICAL DATA:  Patient with diverticulosis c/o bilat lower quadrant pain, fever 101, WBC: 34,200Prior scan 2 days ago. Concerned for abscess  EXAM: CT ABDOMEN AND PELVIS WITH CONTRAST  TECHNIQUE: Multidetector CT imaging of the abdomen and pelvis was performed using the standard protocol following bolus administration of intravenous contrast.  CONTRAST:  162mL OMNIPAQUE IOHEXOL 300 MG/ML  SOLN  COMPARISON:  02/17/2015.  FINDINGS: Gastrointestinal: As noted on prior CT, there is wall thickening of the mid to distal sigmoid colon where there are multiple diverticula. Along the posterior margin of the affected segment of sigmoid colon, there is inflammatory fat stranding and a small amount of extraluminal contrast and air consistent with a localized perforation. However, there is no evidence of  an abscess and there is no free air. Overall, there has been no significant worsening of disease, but no improvement.  There are no other areas of colonic inflammation. There is mild prominence of the proximal small bowel with air-fluid levels suggesting a mild diffuse adynamic ileus. Small bowel is otherwise unremarkable. Normal appendix is visualized.  Lung bases:  Clear.  Heart normal size.  Liver: Small area of focal fat accumulation adjacent to the falciform ligament. Otherwise normal.  Spleen, gallbladder, pancreas, adrenal glands:  Unremarkable.  Kidneys, ureters, bladder:  Unremarkable.  Uterus and adnexa: 2.7 cm partly calcified right uterine fibroid. Uterus normal in overall size. No adnexal masses.  Scattered mildly prominent retroperitoneal lymph nodes. No enlarged lymph nodes.  Ascites:  None.  Musculoskeletal: Disc degenerative changes at L5-S1. No osteoblastic or osteolytic lesions.  IMPRESSION: 1. Sigmoid colon diverticulitis is without significant change from the recent prior study. 2. There is a localized perforation with some extraluminal air, but no evidence of an abscess and no free air. 3. No other acute findings.   Electronically Signed   By: Lajean Manes M.D.   On: 02/19/2015 14:47    Anti-infectives: Anti-infectives    Start     Dose/Rate Route Frequency Ordered Stop   02/18/15 2200  piperacillin-tazobactam (ZOSYN) IVPB 3.375 g     3.375 g 12.5 mL/hr over 240 Minutes Intravenous 3 times per day 02/18/15 1801     02/18/15 0600  ciprofloxacin (CIPRO) IVPB 400 mg  Status:  Discontinued     400 mg 200 mL/hr over 60 Minutes Intravenous Every 12 hours 02/17/15 1808 02/18/15 1801   02/18/15 0600  metroNIDAZOLE (FLAGYL) IVPB 500 mg  Status:  Discontinued     500 mg 100 mL/hr over 60 Minutes Intravenous Every 8 hours 02/17/15 1808 02/18/15 1801   02/17/15 1630  metroNIDAZOLE (FLAGYL) IVPB 500 mg     500 mg 100 mL/hr over 60 Minutes Intravenous  Once 02/17/15 1622 02/17/15 2151    02/17/15 1630  ciprofloxacin (CIPRO) IVPB 400 mg     400 mg 200 mL/hr over 60 Minutes Intravenous  Once 02/17/15 1622 02/17/15 1816      Assessment/Plan: Complicated diverticulitis  Ct scan unchanged, nothing to drain. Wbc stable.  Her exam is significantly improved today though, will continue npo/zosyn/reeval in am Will give dose of toradol x1 for ha before hospitalists can see  [facility] 02/20/2015

## 2015-02-20 NOTE — Progress Notes (Signed)
[facility] PROGRESS NOTE  Ruth Perez MVH:[id number] DOB: [birth year] DOA: 02/17/2015 PCP: Jenny Reichmann, MD  Assessment/Plan:   Sepsis secondary to diverticulitis Secondary to acute plicated diverticulitis with perforation of distal sigmoid colon.  -IV antibiotics was changed from Cipro and Flagyl to Zosyn on 8/5 giving patient significant fever, and pain. -Patient still nothing by mouth per surgical recommendation. - Repeat CT abdomen and pelvis today showing no significant change, only microperforation with no evidence of abscess -Appreciate surgery follow-up. Recommend medical management for now. Continue nothing by mouth.   Diarrhea -  related to acute diverticulitis, resolved, no recurrence . - Stool was tested by CDIFF antigen/toxin (which was antigen +/toxin - ) then  PCR was done ,which was negative.  she is colonized with a non-toxigenic form of c.difficile. no indication for treatment as discussed with ID .   Essential hypertension Blood pressure stable. continue to hold home medications   EtOH abuse Counseled on cessation. Drinks about 6 cans of beer every day.  Monitor on CIWA. on thiamine, folate and multivitamin.   Smoker unmotivated to quit not interested in quitting. place on nicotine patch.    Pancreatic lesion , 40mm on CT scan  Incidental finding. Denies weight loss. Lipase and LFTs was normal. Patient will need MRI of the abdomen once current symptoms improved.  hypokalemia replenished with IV fluids.     DVT prophylaxis: sq lovenox   Code Status: full code Family Communication: None at bedside Disposition Plan: Home once symptoms improved.  [name] [facility] Pager [phone]   Consultants:  CCS  Procedures:  CT abdomen and pelvis  Antibiotics:  IV Cipro and Flagyl since 8/4>>8/5  IV Zosyn 8/5  HPI/Subjective: Still has some bilateral lower quadrant abdominal pain but improved. No nausea or  vomiting.  no further diarrhea. Objective: Filed Vitals:   02/20/15 1431  BP: 132/85  Pulse:   Temp: 100.3 F (37.9 C)  Resp: 18    Intake/Output Summary (Last 24 hours) at 02/20/15 1432 Last data filed at 02/20/15 0537  Gross per 24 hour  Intake 1008.24 ml  Output      0 ml  Net 1008.24 ml   Filed Weights   02/17/15 1813  Weight: 58.741 kg (129 lb 8 oz)    Exam:   General:  Middle aged female in no acute distress  HEENT: No pallor, moist oral mucosa  Chest: Clear to auscultation bilaterally  CVS: Normal S1 and S2  GI: Soft, nondistended, bowel sounds+, bilateral lower quadrant tenderness almost resolved   Musculoskeletal: Warm, no edema   CNS: Alert and oriented, no tremors   Data Reviewed: Basic Metabolic Panel:  Recent Labs Lab 02/17/15 1624 02/18/15 0530  NA 133* 138  K 3.5 3.8  CL 97* 105  CO2 24 25  GLUCOSE 93 130*  BUN 8 5*  CREATININE 0.55 0.60  CALCIUM 9.1 8.9   Liver Function Tests:  Recent Labs Lab 02/17/15 1624  AST 38  ALT 28  ALKPHOS 66  BILITOT 1.1  PROT 7.7  ALBUMIN 4.0    Recent Labs Lab 02/17/15 1624  LIPASE 17*   No results for input(s): AMMONIA in the last 168 hours. CBC:  Recent Labs Lab 02/17/15 1033 02/17/15 1701 02/18/15 0530 02/19/15 0531 02/20/15 0017  WBC 16.6* 15.5* 13.3* 13.2* 14.2*  HGB 13.5 12.3 12.3 11.1* 10.4*  HCT 39.6 35.9* 36.7 33.8* 30.7*  MCV 91.1 93.2 94.1 93.6 92.5  PLT  --  256 258 235  230   Cardiac Enzymes: No results for input(s): CKTOTAL, CKMB, CKMBINDEX, TROPONINI in the last 168 hours. BNP (last 3 results) No results for input(s): BNP in the last 8760 hours.  ProBNP (last 3 results) No results for input(s): PROBNP in the last 8760 hours.  CBG: No results for input(s): GLUCAP in the last 168 hours.  Recent Results (from the past 240 hour(s))  C difficile quick scan w PCR reflex     Status: Abnormal   Collection Time: 02/18/15  9:18 PM  Result Value Ref Range Status    C Diff antigen POSITIVE (A) NEGATIVE Final   C Diff toxin NEGATIVE NEGATIVE Final   C Diff interpretation   Final    C. difficile present, but toxin not detected. This indicates colonization. In most cases, this does not require treatment. If patient has signs and symptoms consistent with colitis, consider treatment.  C difficile quick scan w PCR reflex     Status: None   Collection Time: 02/19/15  5:10 PM  Result Value Ref Range Status   C Diff antigen NEGATIVE NEGATIVE Final   C Diff toxin NEGATIVE NEGATIVE Final   C Diff interpretation Negative for toxigenic C. difficile  Corrected    Comment: NOTIFIED DUMAS,N AT [id number] ON [id number] BY HOOKER,B CORRECTED ON 08/07 AT 1044: PREVIOUSLY REPORTED AS VALID      Studies: Ct Abdomen Pelvis W Contrast  02/19/2015   CLINICAL DATA:  Patient with diverticulosis c/o bilat lower quadrant pain, fever 101, WBC: 34,200Prior scan 2 days ago. Concerned for abscess  EXAM: CT ABDOMEN AND PELVIS WITH CONTRAST  TECHNIQUE: Multidetector CT imaging of the abdomen and pelvis was performed using the standard protocol following bolus administration of intravenous contrast.  CONTRAST:  176mL OMNIPAQUE IOHEXOL 300 MG/ML  SOLN  COMPARISON:  02/17/2015.  FINDINGS: Gastrointestinal: As noted on prior CT, there is wall thickening of the mid to distal sigmoid colon where there are multiple diverticula. Along the posterior margin of the affected segment of sigmoid colon, there is inflammatory fat stranding and a small amount of extraluminal contrast and air consistent with a localized perforation. However, there is no evidence of an abscess and there is no free air. Overall, there has been no significant worsening of disease, but no improvement.  There are no other areas of colonic inflammation. There is mild prominence of the proximal small bowel with air-fluid levels suggesting a mild diffuse adynamic ileus. Small bowel is otherwise unremarkable. Normal appendix is visualized.  Lung  bases:  Clear.  Heart normal size.  Liver: Small area of focal fat accumulation adjacent to the falciform ligament. Otherwise normal.  Spleen, gallbladder, pancreas, adrenal glands:  Unremarkable.  Kidneys, ureters, bladder:  Unremarkable.  Uterus and adnexa: 2.7 cm partly calcified right uterine fibroid. Uterus normal in overall size. No adnexal masses.  Scattered mildly prominent retroperitoneal lymph nodes. No enlarged lymph nodes.  Ascites:  None.  Musculoskeletal: Disc degenerative changes at L5-S1. No osteoblastic or osteolytic lesions.  IMPRESSION: 1. Sigmoid colon diverticulitis is without significant change from the recent prior study. 2. There is a localized perforation with some extraluminal air, but no evidence of an abscess and no free air. 3. No other acute findings.   Electronically Signed   By: Lajean Manes M.D.   On: 02/19/2015 14:47    Scheduled Meds: . enoxaparin (LOVENOX) injection  40 mg Subcutaneous Q24H  . folic acid  1 mg Oral Daily  . multivitamin with minerals  1 tablet Oral  Daily  . nicotine  21 mg Transdermal Daily  . ondansetron (ZOFRAN) IV  4 mg Intravenous Once  . piperacillin-tazobactam (ZOSYN)  IV  3.375 g Intravenous 3 times per day  . thiamine  100 mg Oral Daily   Or  . thiamine  100 mg Intravenous Daily   Continuous Infusions: . dextrose 5 % and 0.45 % NaCl with KCl 20 mEq/L 100 mL/hr at 02/20/15 0537      Time spent:25 minutes    Earlyne Feeser  [facility] Pager [phone]. If 7PM-7AM, please contact night-coverage at [email or web address], password [facility] 02/20/2015, 2:32 PM  LOS: 3 days

## 2015-02-21 LAB — BASIC METABOLIC PANEL
ANION GAP: 7 (ref 5–15)
BUN: <5 mg/dL — ABNORMAL LOW (ref 6–20)
CO2: 23 mmol/L (ref 22–32)
Calcium: 8.8 mg/dL — ABNORMAL LOW (ref 8.9–10.3)
Chloride: 109 mmol/L (ref 101–111)
Creatinine, Ser: 0.44 mg/dL (ref 0.44–1.00)
GFR calc Af Amer: >60 mL/min (ref >60)
GFR calc non Af Amer: >60 mL/min (ref >60)
Glucose, Bld: 90 mg/dL (ref 65–99)
Potassium: 3.5 mmol/L (ref 3.5–5.1)
SODIUM: 139 mmol/L (ref 135–145)

## 2015-02-21 LAB — CBC
HEMATOCRIT: 29.8 % — AB (ref 36.0–46.0)
HEMOGLOBIN: 10 g/dL — AB (ref 12.0–15.0)
MCH: 31.2 pg (ref 26.0–34.0)
MCHC: 33.6 g/dL (ref 30.0–36.0)
MCV: 92.8 fL (ref 78.0–100.0)
Platelets: 266 10*3/uL (ref 150–400)
RBC: 3.21 MIL/uL — ABNORMAL LOW (ref 3.87–5.11)
RDW: 13.7 % (ref 11.5–15.5)
WBC: 9.4 10*3/uL (ref 4.0–10.5)

## 2015-02-21 LAB — CLOSTRIDIUM DIFFICILE BY PCR: CDIFFPCR: NEGATIVE

## 2015-02-21 MED ORDER — BUTALBITAL-APAP-CAFFEINE 50-325-40 MG PO TABS
1.0000 | ORAL_TABLET | ORAL | Status: DC | PRN
  Administered 2015-02-21 – 2015-02-23 (×9): 1 via ORAL
  Filled 2015-02-21 (×9): qty 1

## 2015-02-21 NOTE — Progress Notes (Signed)
Date:  February 21, 2015 U.R. performed for needs and level of care. Will continue to follow for Case Management needs.  Rhonda Davis, RN, BSN, CCM   [phone] 

## 2015-02-21 NOTE — Progress Notes (Signed)
[facility] PROGRESS NOTE  Ruth Perez ZTI:[id number] DOB: 07-20-1965 DOA: 02/17/2015 PCP: Jenny Reichmann, MD  Assessment/Plan:   Sepsis secondary to diverticulitis Secondary to acute plicated diverticulitis with perforation of distal sigmoid colon.  -IV antibiotics was changed from Cipro and Flagyl to Zosyn on 8/5 giving patient significant fever, and pain. -Patient still nothing by mouth per surgical recommendation. - Repeat CT abdomen and pelvis today showing no significant change, only microperforation with no evidence of abscess -Appreciate surgery follow-up. Recommend medical management for now. - A shunt was started on clear liquid by surgery given improvement of her abdominal pain, no recurrence of fever.   Diarrhea -  related to acute diverticulitis, resolved, no recurrence . - Stool was tested by CDIFF antigen/toxin (which was antigen +/toxin - ) then  PCR was done ,which was negative.  she is colonized with a non-toxigenic form of c.difficile. no indication for treatment as discussed with ID .   Essential hypertension Blood pressure stable. continue to hold home medications   EtOH abuse Counseled on cessation. Drinks about 6 cans of beer every day.  Monitor on CIWA. on thiamine, folate and multivitamin.   Smoker unmotivated to quit not interested in quitting. place on nicotine patch.    Pancreatic lesion , 38mm on CT scan  Incidental finding. Denies weight loss. Lipase and LFTs was normal. Patient will need MRI of the abdomen once current symptoms improved.  hypokalemia replenished with IV fluids.     DVT prophylaxis: sq lovenox   Code Status: full code Family Communication: None at bedside Disposition Plan: Home once symptoms improved.  [name] [facility] Pager [phone]   Consultants:  CCS  Procedures:  CT abdomen and pelvis  Antibiotics:  IV Cipro and Flagyl since 8/4>>8/5  IV Zosyn  8/5  Subjective  Patient reports abdominal pain resolved , no further nausea, no vomiting, no diarrhea , place of occasional headache .  Objective: Filed Vitals:   02/21/15 1425  BP: 136/73  Pulse: 84  Temp: 99.3 F (37.4 C)  Resp: 18    Intake/Output Summary (Last 24 hours) at 02/21/15 1502 Last data filed at 02/21/15 0800  Gross per 24 hour  Intake 1238.33 ml  Output      0 ml  Net 1238.33 ml   Filed Weights   02/17/15 1813  Weight: 58.741 kg (129 lb 8 oz)    Exam:   General:  Middle aged female in no acute distress  HEENT: No pallor, moist oral mucosa  Chest: Clear to auscultation bilaterally  CVS: Normal S1 and S2  GI: Soft, nondistended, bowel sounds+, bilateral lower quadrant tenderness  resolved   Musculoskeletal: Warm, no edema   CNS: Alert and oriented, no tremors   Data Reviewed: Basic Metabolic Panel:  Recent Labs Lab 02/17/15 1624 02/18/15 0530 02/21/15 0510  NA 133* 138 139  K 3.5 3.8 3.5  CL 97* 105 109  CO2 24 25 23   GLUCOSE 93 130* 90  BUN 8 5* <5*  CREATININE 0.55 0.60 0.44  CALCIUM 9.1 8.9 8.8*   Liver Function Tests:  Recent Labs Lab 02/17/15 1624  AST 38  ALT 28  ALKPHOS 66  BILITOT 1.1  PROT 7.7  ALBUMIN 4.0    Recent Labs Lab 02/17/15 1624  LIPASE 17*   No results for input(s): AMMONIA in the last 168 hours. CBC:  Recent Labs Lab 02/17/15 1701 02/18/15 0530 02/19/15 0531 02/20/15 0017 02/21/15 0510  WBC 15.5* 13.3* 13.2* 14.2* 9.4  HGB 12.3 12.3 11.1* 10.4* 10.0*  HCT 35.9* 36.7 33.8* 30.7* 29.8*  MCV 93.2 94.1 93.6 92.5 92.8  PLT 256 258 235 230 266   Cardiac Enzymes: No results for input(s): CKTOTAL, CKMB, CKMBINDEX, TROPONINI in the last 168 hours. BNP (last 3 results) No results for input(s): BNP in the last 8760 hours.  ProBNP (last 3 results) No results for input(s): PROBNP in the last 8760 hours.  CBG: No results for input(s): GLUCAP in the last 168 hours.  Recent Results (from the  past 240 hour(s))  C difficile quick scan w PCR reflex     Status: Abnormal   Collection Time: 02/18/15  9:18 PM  Result Value Ref Range Status   C Diff antigen POSITIVE (A) NEGATIVE Final   C Diff toxin NEGATIVE NEGATIVE Final   C Diff interpretation   Final    C. difficile present, but toxin not detected. This indicates colonization. In most cases, this does not require treatment. If patient has signs and symptoms consistent with colitis, consider treatment.  Clostridium Difficile by PCR     Status: None   Collection Time: 02/18/15  9:18 PM  Result Value Ref Range Status   Toxigenic C Difficile by pcr NEGATIVE NEGATIVE Final  C difficile quick scan w PCR reflex     Status: None   Collection Time: 02/19/15  5:10 PM  Result Value Ref Range Status   C Diff antigen NEGATIVE NEGATIVE Final   C Diff toxin NEGATIVE NEGATIVE Final   C Diff interpretation Negative for toxigenic C. difficile  Corrected    Comment: NOTIFIED DUMAS,N AT [id number] ON [id number] BY HOOKER,B CORRECTED ON 08/07 AT 1044: PREVIOUSLY REPORTED AS VALID      Studies: No results found.  Scheduled Meds: . enoxaparin (LOVENOX) injection  40 mg Subcutaneous Q24H  . folic acid  1 mg Oral Daily  . multivitamin with minerals  1 tablet Oral Daily  . nicotine  21 mg Transdermal Daily  . ondansetron (ZOFRAN) IV  4 mg Intravenous Once  . piperacillin-tazobactam (ZOSYN)  IV  3.375 g Intravenous 3 times per day  . thiamine  100 mg Oral Daily   Or  . thiamine  100 mg Intravenous Daily   Continuous Infusions: . dextrose 5 % and 0.45 % NaCl with KCl 20 mEq/L 100 mL/hr at 02/20/15 1719      Time spent:20 minutes    Lesle Faron  [facility] Pager [phone]. If 7PM-7AM, please contact night-coverage at [email or web address], password [facility] 02/21/2015, 3:02 PM  LOS: 4 days

## 2015-02-21 NOTE — Progress Notes (Signed)
Subjective: Primary complaint is headache, abdominal pain is much better.  She has multiple issues with pain meds so I will defer to primary.  Objective: Vital signs in last 24 hours: Temp:  [98.3 F (36.8 C)-100.3 F (37.9 C)] 98.3 F (36.8 C) (08/08 0536) Pulse Rate:  [81-87] 81 (08/08 0536) Resp:  [18] 18 (08/08 0536) BP: (120-132)/(79-89) 126/79 mmHg (08/08 0536) SpO2:  [97 %-100 %] 98 % (08/08 0536) Last BM Date: 02/20/15  Intake/Output from previous day: 08/07 0701 - 08/08 0700 In: 1238.3 [I.V.:1238.3] Out: -  Intake/Output this shift:    General appearance: alert, cooperative, no distress and complaining of Headache GI: soft, non-tender; bowel sounds normal; no masses,  no organomegaly and pain last week both left and Right lower quadrants  Lab Results:   Recent Labs  02/20/15 0017 02/21/15 0510  WBC 14.2* 9.4  HGB 10.4* 10.0*  HCT 30.7* 29.8*  PLT 230 266    BMET  Recent Labs  02/21/15 0510  NA 139  K 3.5  CL 109  CO2 23  GLUCOSE 90  BUN <5*  CREATININE 0.44  CALCIUM 8.8*   PT/INR No results for input(s): LABPROT, INR in the last 72 hours.   Recent Labs Lab 02/17/15 1624  AST 38  ALT 28  ALKPHOS 66  BILITOT 1.1  PROT 7.7  ALBUMIN 4.0     Lipase     Component Value Date/Time   LIPASE 17* 02/17/2015 1624     Studies/Results: Ct Abdomen Pelvis W Contrast  02/19/2015   CLINICAL DATA:  Patient with diverticulosis c/o bilat lower quadrant pain, fever 101, WBC: 34,200Prior scan 2 days ago. Concerned for abscess  EXAM: CT ABDOMEN AND PELVIS WITH CONTRAST  TECHNIQUE: Multidetector CT imaging of the abdomen and pelvis was performed using the standard protocol following bolus administration of intravenous contrast.  CONTRAST:  158mL OMNIPAQUE IOHEXOL 300 MG/ML  SOLN  COMPARISON:  02/17/2015.  FINDINGS: Gastrointestinal: As noted on prior CT, there is wall thickening of the mid to distal sigmoid colon where there are multiple diverticula. Along  the posterior margin of the affected segment of sigmoid colon, there is inflammatory fat stranding and a small amount of extraluminal contrast and air consistent with a localized perforation. However, there is no evidence of an abscess and there is no free air. Overall, there has been no significant worsening of disease, but no improvement.  There are no other areas of colonic inflammation. There is mild prominence of the proximal small bowel with air-fluid levels suggesting a mild diffuse adynamic ileus. Small bowel is otherwise unremarkable. Normal appendix is visualized.  Lung bases:  Clear.  Heart normal size.  Liver: Small area of focal fat accumulation adjacent to the falciform ligament. Otherwise normal.  Spleen, gallbladder, pancreas, adrenal glands:  Unremarkable.  Kidneys, ureters, bladder:  Unremarkable.  Uterus and adnexa: 2.7 cm partly calcified right uterine fibroid. Uterus normal in overall size. No adnexal masses.  Scattered mildly prominent retroperitoneal lymph nodes. No enlarged lymph nodes.  Ascites:  None.  Musculoskeletal: Disc degenerative changes at L5-S1. No osteoblastic or osteolytic lesions.  IMPRESSION: 1. Sigmoid colon diverticulitis is without significant change from the recent prior study. 2. There is a localized perforation with some extraluminal air, but no evidence of an abscess and no free air. 3. No other acute findings.   Electronically Signed   By: Lajean Manes M.D.   On: 02/19/2015 14:47    Medications: . enoxaparin (LOVENOX) injection  40 mg Subcutaneous  Y05R  . folic acid  1 mg Oral Daily  . multivitamin with minerals  1 tablet Oral Daily  . nicotine  21 mg Transdermal Daily  . ondansetron (ZOFRAN) IV  4 mg Intravenous Once  . piperacillin-tazobactam (ZOSYN)  IV  3.375 g Intravenous 3 times per day  . thiamine  100 mg Oral Daily   Or  . thiamine  100 mg Intravenous Daily    Assessment/Plan 1. Diverticulitis descending and sigmoid colon 2. HTN 3.  Hyperlipidemia 4. Smokes cigarettes  She needs to quit, but it does not sound like she is very inclined to. 5. History of alcohol abuse. 7. Pancreatic lesion seen on CT  7 mm 8. Hematuria - seen by Dr. Diona Fanti, but eval negative. 9. Antibiotics: Day 1.5 Cipro/Flagyl - converted to Zosyn PM 02/17/14 DAy 3.5 of Zosyn; day 5 antibiotics 10. DVT: Lovenox/SCD    Plan:  Clear tray, see how she does, headache, pain meds per Dr. Waldron Labs.    LOS: 4 days    Ruth Perez 02/21/2015

## 2015-02-22 LAB — CBC
HEMATOCRIT: 32.8 % — AB (ref 36.0–46.0)
HEMOGLOBIN: 11.1 g/dL — AB (ref 12.0–15.0)
MCH: 31.7 pg (ref 26.0–34.0)
MCHC: 33.8 g/dL (ref 30.0–36.0)
MCV: 93.7 fL (ref 78.0–100.0)
Platelets: 348 10*3/uL (ref 150–400)
RBC: 3.5 MIL/uL — AB (ref 3.87–5.11)
RDW: 13.9 % (ref 11.5–15.5)
WBC: 9.8 10*3/uL (ref 4.0–10.5)

## 2015-02-22 NOTE — Progress Notes (Signed)
  Subjective: She seems better, some gas pains this AM, but lower abdominal tenderness seems better.  Objective: Vital signs in last 24 hours: Temp:  [98.2 F (36.8 C)-99.3 F (37.4 C)] 98.5 F (36.9 C) (08/09 0526) Pulse Rate:  [83-88] 88 (08/09 0526) Resp:  [18] 18 (08/09 0526) BP: (123-136)/(73-81) 123/81 mmHg (08/08 2141) SpO2:  [98 %-100 %] 99 % (08/09 0526) Last BM Date: 02/21/15 PO not recorded  Clears started yesterday Afebrile, VSS WBC remains normal Intake/Output from previous day: 08/08 0701 - 08/09 0700 In: 2123 [I.V.:2123] Out: -  Intake/Output this shift:    General appearance: alert, cooperative and no distress GI: soft, not tender, + BS.  Lab Results:   Recent Labs  02/21/15 0510 02/22/15 0545  WBC 9.4 9.8  HGB 10.0* 11.1*  HCT 29.8* 32.8*  PLT 266 348    BMET  Recent Labs  02/21/15 0510  NA 139  K 3.5  CL 109  CO2 23  GLUCOSE 90  BUN <5*  CREATININE 0.44  CALCIUM 8.8*   PT/INR No results for input(s): LABPROT, INR in the last 72 hours.   Recent Labs Lab 02/17/15 1624  AST 38  ALT 28  ALKPHOS 66  BILITOT 1.1  PROT 7.7  ALBUMIN 4.0     Lipase     Component Value Date/Time   LIPASE 17* 02/17/2015 1624     Studies/Results: No results found.  Medications: . enoxaparin (LOVENOX) injection  40 mg Subcutaneous Q24H  . folic acid  1 mg Oral Daily  . multivitamin with minerals  1 tablet Oral Daily  . nicotine  21 mg Transdermal Daily  . ondansetron (ZOFRAN) IV  4 mg Intravenous Once  . piperacillin-tazobactam (ZOSYN)  IV  3.375 g Intravenous 3 times per day  . thiamine  100 mg Oral Daily   Or  . thiamine  100 mg Intravenous Daily    Assessment/Plan 1. Diverticulitis descending and sigmoid colon 2. HTN 3. Hyperlipidemia 4. Smokes cigarettes  She needs to quit, but it does not sound like she is very inclined to. 5. History of alcohol abuse. 7. Pancreatic lesion seen on CT  7 mm 8.  Hematuria - seen by Dr. Diona Fanti, but eval negative. 9. Antibiotics: Day 1.5 Cipro/Flagyl - converted to Zosyn PM 02/17/14 Day 4.5 of Zosyn; day 6 antibiotics 10. DVT: Lovenox/SCD   Plan:  Advance diet today, and check on converting to oral antibiotics either today or tomorrow.      LOS: 5 days    Kensey Luepke 02/22/2015

## 2015-02-22 NOTE — Progress Notes (Signed)
[facility] PROGRESS NOTE  Ruth Perez XAJ:[id number] DOB: 06-22-1966 DOA: 02/17/2015 PCP: Jenny Reichmann, MD  Assessment/Plan:   Sepsis secondary to diverticulitis Secondary to acute plicated diverticulitis with perforation of distal sigmoid colon.  -IV antibiotics was changed from Cipro and Flagyl to Zosyn on 8/5 giving patient significant fever, and pain. -Patient still nothing by mouth per surgical recommendation. - Repeat CT abdomen and pelvis today showing no significant change, only microperforation with no evidence of abscess -Appreciate surgery follow-up. Recommend medical management for now. -patient  diet is being advanced, started on full liquid by surgery today, hopefully can be transitioned to oral antibiotics and 1 day.  Diarrhea -  related to acute diverticulitis, resolved, no recurrence . - Stool was tested by CDIFF antigen/toxin (which was antigen +/toxin - ) then  PCR was done ,which was negative.  she is colonized with a non-toxigenic form of c.difficile. no indication for treatment as discussed with ID .   Essential hypertension Blood pressure stable off home medications   EtOH abuse Counseled on cessation. Drinks about 6 cans of beer every day.  Monitor on CIWA. on thiamine, folate and multivitamin.   Smoker unmotivated to quit not interested in quitting.  on nicotine patch.    Pancreatic lesion , 3mm on CT scan  Incidental finding. Denies weight loss. Lipase and LFTs was normal. Patient will need MRI of the abdomen once current symptoms improved.  hypokalemia replenished      DVT prophylaxis: sq lovenox   Code Status: full code Family Communication: None at bedside Disposition Plan: Home once symptoms improved.     Consultants:  CCS  Procedures:  CT abdomen and pelvis  Antibiotics:  IV Cipro and Flagyl since 8/4>>8/5  IV Zosyn 8/5  Subjective  Patient reports abdominal pain resolved , no further nausea, no  vomiting, no diarrhea , place of occasional headache .  Objective: Filed Vitals:   02/22/15 0526  BP:   Pulse: 88  Temp: 98.5 F (36.9 C)  Resp: 18    Intake/Output Summary (Last 24 hours) at 02/22/15 1448 Last data filed at 02/22/15 0523  Gross per 24 hour  Intake   2123 ml  Output      0 ml  Net   2123 ml   Filed Weights   02/17/15 1813  Weight: 58.741 kg (129 lb 8 oz)    Exam:   General:  Middle aged female in no acute distress  HEENT: No pallor, moist oral mucosa  Chest: Clear to auscultation bilaterally  CVS: Normal S1 and S2  GI: Soft, nondistended, bowel sounds+, bilateral lower quadrant tenderness  resolved   Musculoskeletal: Warm, no edema   CNS: Alert and oriented, no tremors   Data Reviewed: Basic Metabolic Panel:  Recent Labs Lab 02/17/15 1624 02/18/15 0530 02/21/15 0510  NA 133* 138 139  K 3.5 3.8 3.5  CL 97* 105 109  CO2 24 25 23   GLUCOSE 93 130* 90  BUN 8 5* <5*  CREATININE 0.55 0.60 0.44  CALCIUM 9.1 8.9 8.8*   Liver Function Tests:  Recent Labs Lab 02/17/15 1624  AST 38  ALT 28  ALKPHOS 66  BILITOT 1.1  PROT 7.7  ALBUMIN 4.0    Recent Labs Lab 02/17/15 1624  LIPASE 17*   No results for input(s): AMMONIA in the last 168 hours. CBC:  Recent Labs Lab 02/18/15 0530 02/19/15 0531 02/20/15 0017 02/21/15 0510 02/22/15 0545  WBC 13.3* 13.2* 14.2* 9.4 9.8  HGB 12.3  11.1* 10.4* 10.0* 11.1*  HCT 36.7 33.8* 30.7* 29.8* 32.8*  MCV 94.1 93.6 92.5 92.8 93.7  PLT 258 235 230 266 348   Cardiac Enzymes: No results for input(s): CKTOTAL, CKMB, CKMBINDEX, TROPONINI in the last 168 hours. BNP (last 3 results) No results for input(s): BNP in the last 8760 hours.  ProBNP (last 3 results) No results for input(s): PROBNP in the last 8760 hours.  CBG: No results for input(s): GLUCAP in the last 168 hours.  Recent Results (from the past 240 hour(s))  C difficile quick scan w PCR reflex     Status: Abnormal   Collection  Time: 02/18/15  9:18 PM  Result Value Ref Range Status   C Diff antigen POSITIVE (A) NEGATIVE Final   C Diff toxin NEGATIVE NEGATIVE Final   C Diff interpretation   Final    C. difficile present, but toxin not detected. This indicates colonization. In most cases, this does not require treatment. If patient has signs and symptoms consistent with colitis, consider treatment.  Clostridium Difficile by PCR     Status: None   Collection Time: 02/18/15  9:18 PM  Result Value Ref Range Status   Toxigenic C Difficile by pcr NEGATIVE NEGATIVE Final  C difficile quick scan w PCR reflex     Status: None   Collection Time: 02/19/15  5:10 PM  Result Value Ref Range Status   C Diff antigen NEGATIVE NEGATIVE Final   C Diff toxin NEGATIVE NEGATIVE Final   C Diff interpretation Negative for toxigenic C. difficile  Corrected    Comment: NOTIFIED DUMAS,N AT [id number] ON [id number] BY HOOKER,B CORRECTED ON 08/07 AT 1044: PREVIOUSLY REPORTED AS VALID      Studies: No results found.  Scheduled Meds: . enoxaparin (LOVENOX) injection  40 mg Subcutaneous Q24H  . folic acid  1 mg Oral Daily  . multivitamin with minerals  1 tablet Oral Daily  . nicotine  21 mg Transdermal Daily  . ondansetron (ZOFRAN) IV  4 mg Intravenous Once  . piperacillin-tazobactam (ZOSYN)  IV  3.375 g Intravenous 3 times per day  . thiamine  100 mg Oral Daily   Continuous Infusions: . dextrose 5 % and 0.45 % NaCl with KCl 20 mEq/L 100 mL/hr at 02/22/15 0521      Time spent:20 minutes    Tailor Westfall  [facility] Pager [phone]. If 7PM-7AM, please contact night-coverage at [email or web address], password [facility] 02/22/2015, 2:48 PM  LOS: 5 days

## 2015-02-23 ENCOUNTER — Other Ambulatory Visit: Payer: Self-pay

## 2015-02-23 DIAGNOSIS — K8689 Other specified diseases of pancreas: Secondary | ICD-10-CM

## 2015-02-23 MED ORDER — CIPROFLOXACIN HCL 500 MG PO TABS: 500.0000 mg | ORAL_TABLET | Freq: Two times a day (BID) | ORAL | Status: DC

## 2015-02-23 MED ORDER — NICOTINE 21 MG/24HR TD PT24: 21.0000 mg | MEDICATED_PATCH | Freq: Every day | TRANSDERMAL | Status: DC

## 2015-02-23 MED ORDER — THIAMINE HCL 100 MG PO TABS: 100.0000 mg | ORAL_TABLET | Freq: Every day | ORAL | Status: DC

## 2015-02-23 MED ORDER — FOLIC ACID 1 MG PO TABS: 1.0000 mg | ORAL_TABLET | Freq: Every day | ORAL | Status: DC

## 2015-02-23 MED ORDER — ADULT MULTIVITAMIN W/MINERALS CH: 1.0000 | ORAL_TABLET | Freq: Every day | ORAL | Status: DC

## 2015-02-23 MED ORDER — ACETAMINOPHEN 325 MG PO TABS: 650.0000 mg | ORAL_TABLET | Freq: Four times a day (QID) | ORAL | Status: DC | PRN

## 2015-02-23 MED ORDER — ONDANSETRON HCL 4 MG PO TABS: 4.0000 mg | ORAL_TABLET | Freq: Four times a day (QID) | ORAL | Status: DC | PRN

## 2015-02-23 MED ORDER — METRONIDAZOLE 500 MG PO TABS: 500.0000 mg | ORAL_TABLET | Freq: Three times a day (TID) | ORAL | Status: DC

## 2015-02-23 NOTE — Progress Notes (Signed)
  Subjective: Doing well on soft diet, no pain.  She says she may have had some blood in stool.  I recommended she stay on soft diet for another week.  Objective: Vital signs in last 24 hours: Temp:  [98.2 F (36.8 C)-98.7 F (37.1 C)] 98.7 F (37.1 C) (08/10 0536) Pulse Rate:  [72-91] 72 (08/10 0536) Resp:  [20] 20 (08/10 0536) BP: (127-131)/(77-89) 131/77 mmHg (08/10 0536) SpO2:  [97 %-98 %] 97 % (08/10 0536) Last BM Date: 02/21/15 PO not recorded  Soft diet Afebrile, VSS Labs OK Intake/Output from previous day:   Intake/Output this shift:    General appearance: alert, cooperative and no distress GI: soft, non-tender; bowel sounds normal; no masses,  no organomegaly  Lab Results:   Recent Labs  02/21/15 0510 02/22/15 0545  WBC 9.4 9.8  HGB 10.0* 11.1*  HCT 29.8* 32.8*  PLT 266 348    BMET  Recent Labs  02/21/15 0510  NA 139  K 3.5  CL 109  CO2 23  GLUCOSE 90  BUN <5*  CREATININE 0.44  CALCIUM 8.8*   PT/INR No results for input(s): LABPROT, INR in the last 72 hours.   Recent Labs Lab 02/17/15 1624  AST 38  ALT 28  ALKPHOS 66  BILITOT 1.1  PROT 7.7  ALBUMIN 4.0     Lipase     Component Value Date/Time   LIPASE 17* 02/17/2015 1624     Studies/Results: No results found.  Medications: . enoxaparin (LOVENOX) injection  40 mg Subcutaneous Q24H  . folic acid  1 mg Oral Daily  . multivitamin with minerals  1 tablet Oral Daily  . nicotine  21 mg Transdermal Daily  . ondansetron (ZOFRAN) IV  4 mg Intravenous Once  . piperacillin-tazobactam (ZOSYN)  IV  3.375 g Intravenous 3 times per day  . thiamine  100 mg Oral Daily    Assessment/Plan 1. Diverticulitis descending and sigmoid colon 2. HTN 3. Hyperlipidemia 4. Smokes cigarettes  She needs to quit, but it does not sound like she is very inclined to. 5. History of alcohol abuse. 7. Pancreatic lesion seen on CT  7 mm 8. Hematuria - seen by Dr. Diona Fanti,  but eval negative. 9. Antibiotics: Day 1.5 Cipro/Flagyl - converted to Zosyn PM 02/17/14 Day 4.5 of Zosyn; day 7 antibiotics 10. DVT: Lovenox/SCD    Plan:  From our standpoint she can go home on a week of additional oral antibiotics.  She can follow up with PCP.  There is no need at this time for surgical follow up.  Please call if we can be of any further assistance.     LOS: 6 days    Demarri Elie 02/23/2015

## 2015-02-23 NOTE — Discharge Instructions (Signed)
Follow with Primary MD DAUB, STEVE A, MD in 7 days   Get CBC, CMP, checked  by Primary MD next visit.    Activity: As tolerated with Full fall precautions use walker/cane & assistance as needed   Disposition Home    Diet: Soft diet, with feeding assistance and aspiration precautions.  For Heart failure patients - Check your Weight same time everyday, if you gain over 2 pounds, or you develop in leg swelling, experience more shortness of breath or chest pain, call your Primary MD immediately. Follow Cardiac Low Salt Diet and 1.5 lit/day fluid restriction.   On your next visit with your primary care physician please Get Medicines reviewed and adjusted.   Please request your Prim.MD to go over all Hospital Tests and Procedure/Radiological results at the follow up, please get all Hospital records sent to your Prim MD by signing hospital release before you go home.   If you experience worsening of your admission symptoms, develop shortness of breath, life threatening emergency, suicidal or homicidal thoughts you must seek medical attention immediately by calling 911 or calling your MD immediately  if symptoms less severe.  You Must read complete instructions/literature along with all the possible adverse reactions/side effects for all the Medicines you take and that have been prescribed to you. Take any new Medicines after you have completely understood and accpet all the possible adverse reactions/side effects.   Do not drive, operating heavy machinery, perform activities at heights, swimming or participation in water activities or provide baby sitting services if your were admitted for syncope or siezures until you have seen by Primary MD or a Neurologist and advised to do so again.  Do not drive when taking Pain medications.    Do not take more than prescribed Pain, Sleep and Anxiety Medications  Special Instructions: If you have smoked or chewed Tobacco  in the last 2 yrs please stop  smoking, stop any regular Alcohol  and or any Recreational drug use.  Wear Seat belts while driving.   Please note  You were cared for by a hospitalist during your hospital stay. If you have any questions about your discharge medications or the care you received while you were in the hospital after you are discharged, you can call the unit and asked to speak with the hospitalist on call if the hospitalist that took care of you is not available. Once you are discharged, your primary care physician will handle any further medical issues. Please note that NO REFILLS for any discharge medications will be authorized once you are discharged, as it is imperative that you return to your primary care physician (or establish a relationship with a primary care physician if you do not have one) for your aftercare needs so that they can reassess your need for medications and monitor your lab values.  Soft-Food Meal Plan  A soft-food meal plan includes foods that are safe and easy to swallow. This meal plan typically is used:  If you are having trouble chewing or swallowing foods.  As a transition meal plan after only having had liquid meals for a long period. WHAT DO I NEED TO KNOW ABOUT THE SOFT-FOOD MEAL PLAN?  A soft-food meal plan includes tender foods that are soft and easy to chew and swallow. In most cases, bite-sized pieces of food are easier to swallow. A bite-sized piece is about  inch or smaller. Foods in this plan do not need to be ground or pureed.  Foods that are  very hard, crunchy, or sticky should be avoided. Also, breads, cereals, yogurts, and desserts with nuts, seeds, or fruits should be avoided.  WHAT FOODS CAN I EAT?  Grains  Rice and wild rice. Moist bread, dressing, pasta, and noodles. Well-moistened dry or cooked cereals, such as farina (cooked wheat cereal), oatmeal, or grits. Biscuits, breads, muffins, pancakes, and waffles that have been well moistened.  Vegetables  Shredded  lettuce. Cooked, tender vegetables, including potatoes without skins. Vegetable juices. Broths or creamed soups made with vegetables that are not stringy or chewy. Strained tomatoes (without seeds).  Fruits  Canned or well-cooked fruits. Soft (ripe), peeled fresh fruits, such as peaches, nectarines, kiwi, cantaloupe, honeydew melon, and watermelon (without seeds). Soft berries with small seeds, such as strawberries. Fruit juices (without pulp).  Meats and Other Protein Sources  Moist, tender, lean beef. Mutton. Lamb. Veal. Chicken. [place]. Liver. Ham. Fish without bones. Eggs.  Dairy  Milk, milk drinks, and cream. Plain cream cheese and cottage cheese. Plain yogurt.  Sweets/Desserts  Flavored gelatin desserts. Custard. Plain ice cream, frozen yogurt, sherbet, milk shakes, and malts. Plain cakes and cookies. Plain hard candy.  Other  Butter, margarine (without trans fat), and cooking oils. Mayonnaise. Cream sauces. Mild spices, salt, and sugar. Syrup, molasses, honey, and jelly.  The items listed above may not be a complete list of recommended foods or beverages. Contact your dietitian for more options.  WHAT FOODS ARE NOT RECOMMENDED?  Grains  Dry bread, toast, crackers that have not been moistened. Coarse or dry cereals, such as bran, granola, and shredded wheat. Tough or chewy crusty breads, such as [place] bread or baguettes.  Vegetables  Corn. Raw vegetables except shredded lettuce. Cooked vegetables that are tough or stringy. Tough, crisp, fried potatoes and potato skins.  Fruits  Fresh fruits with skins or seeds or both, such as apples, pears, or grapes. Stringy, high-pulp fruits, such as papaya, pineapple, coconut, or mango. Fruit leather, fruit roll-ups, and all dried fruits.  Meats and Other Protein Sources  Sausages and hot dogs. Meats with gristle. Fish with bones. Nuts, seeds, and chunky peanut or other nut butters.  Sweets/Desserts  Cakes or cookies that are very dry or chewy.  The  items listed above may not be a complete list of foods and beverages to avoid. Contact your dietitian for more information.  Document Released: 10/09/2007 Document Revised: 07/07/2013 Document Reviewed: 05/29/2013  [facility] Patient Information 2015 [place], [place]. This information is not intended to replace advice given to you by your health care provider. Make sure you discuss any questions you have with your health care provider.

## 2015-02-23 NOTE — Discharge Summary (Signed)
Ruth Perez, is Perez 49 y.o. female  DOB 1965-08-31  MRN [id number].  Admission date:  02/17/2015  Admitting Physician  No admitting provider for patient encounter.  Discharge Date:  02/23/2015   Primary Perez  Ruth Reichmann, Perez  Recommendations for primary care physician for things to follow:  - Check CBC, BMP during next visit. - Continue with soft diet for the next week. - Blood pressure medication has been held on discharge, can be resumed if patient blood pressure started to increase. - Patient will need MRI of the abdomen once current symptoms improved to follow on 7 mm pancreatic lesion on CT abdomen.  Admission Diagnosis  sent from radiology    Discharge Diagnosis  sent from radiology     Principal Problem:   SIRS (systemic inflammatory response syndrome) Active Problems:   Essential hypertension   EtOH dependence   Smoker unmotivated to quit   Leukocytosis   Diverticulosis   Fever   Alcohol abuse   Acute diverticulitis   Pancreatic lesion   Colonic diverticular abscess      Past Medical History  Diagnosis Date  . Hypertension   . Hyperlipidemia   . Arthritis   . Alcohol abuse     Past Surgical History  Procedure Laterality Date  . Tonsillectomy    . Mouth surgery         History of present illness and  Hospital Course:     Kindly see H&P for history of present illness and admission details, please review complete Labs, Consult reports and Test reports for all details in brief  HPI  from the history and physical done on the day of admission 02/17/2015  49 year old female with history of hypertension, hyperlipidemia, tobacco and alcohol abuse , colonoscopy in 06/2014 with multiple polyps removed (pathology showing tubular adenoma without signs of high-grade dysplasia or malignancy), history of hematuria with normal cystoscopy in 2015 (sees Dr Ruth Perez) , presented to her  PCP with 1 day history of bilateral lower quadrant abdominal pain associated with nausea but no vomiting. The pain was crampy 7-8/10 in severity, nonradiating and without any aggravating or relieving factors. She reports having Perez fever of 101F yesterday afternoon and this morning as well. She denies eating outside, recent travel or any sick contact. Denies being on antibiotics recently. Reports having an episode of loose bowel movement today but denies any blood per rectum or bloody stool. Patient denies headache, dizziness, vomiting, chest pain, palpitations, SOB, or urinary symptoms. Denies change in weight or appetite. Patient seen by her PCP and blood work done not showing leukocytosis with WBC of 16.6. Patient sent to the ED. Perez CT scan of the abdomen and pelvis done in the ED showed sigmoid diverticulosis with diverticulitis and severe pericolonic inflammatory changes with possible abscess formation at this into the distal sigmoid colon. Comments on no drainable fluid collection. Also shows an incidental finding of 7 mm hypoattenuated pancreatic mass and recommends further evaluation with an MRI. Also is on Perez lighting appearing focus  within the right side of L3 vertebral body about 5 mm in diameter which is possibly Perez nonspecific finding. Patient in the ED had Perez 2-D Fahrenheit, tachycardic to 112 with normal respiratory rate and blood pressure. Sats were normal. Blood work be to assure WBC of 15.5. Patient met criteria for SIRS and hospitalist admission requested to medical floor. Patient was given Perez dose of IV ciprofloxacin and Flagyl in the ED.   Hospital Course   Sepsis secondary to diverticulitis Secondary to acute complicated diverticulitis with perforation of distal sigmoid colon.  - Initially on IV Cipro and Flagyl ,IV antibiotics was changed from Cipro and Flagyl to Zosyn on 8/5 giving patient significant fever, and pain. - Repeat CT abdomen and pelvis showing no significant change, only  microperforation with no evidence of abscess - Patient afebrile for the last 72 hours, leukocytosis resolved, advanced to soft diet which has been tolerating for 24 hours, the plan is to discharge her on soft diet for 1 week, to finish another week or oral ciprofloxacin and Flagyl as per surgical recommendation. - Sepsis resolved.  Diarrhea - related to acute diverticulitis, resolved, no recurrence . - Stool was tested by CDIFF antigen/toxin (which was antigen +/toxin - ) then PCR was done ,which was negative. she is colonized with Perez non-toxigenic form of c.difficile. no indication for treatment as discussed with ID .   Essential hypertension Blood pressure stable off home medications, so will hold on discharge, can be resumed by PCP if blood pressure started to increase.   EtOH abuse Counseled on cessation. Drinks about 6 cans of beer every day.  on CIWA during hospital stay, no Ativan requirement for few days, will discharge on thiamine, folate and multivitamin.   Smoker unmotivated to quit not interested in quitting. on nicotine patch.    Pancreatic lesion , 38m on CT scan  Incidental finding. Denies weight loss. Lipase and LFTs was normal. Patient will need MRI of the abdomen once current symptoms improved.  hypokalemia replenished        Discharge Condition:  stable   Follow UP  Follow-up Information    Call DAUB, SLina Sayre Perez.   Specialty:  Family Medicine   Why:  Posthospitalization follow-up   Contact information:   [phone]        Discharge Instructions  and  Discharge Medications     Discharge Instructions    Discharge instructions    Complete by:  As directed   Follow with Primary Perez DAUB, Ruth Perez in 7 days   Get CBC, CMP, checked  by Primary Perez next visit.    Activity: As tolerated with Full fall precautions use walker/cane & assistance as needed   Disposition Home    Diet: Soft diet,  with feeding assistance and aspiration precautions.  For Heart failure patients - Check your Weight same time everyday, if you gain over 2 pounds, or you develop in leg swelling, experience more shortness of breath or chest pain, call your Primary Perez immediately. Follow Cardiac Low Salt Diet and 1.5 lit/day fluid restriction.   On your next visit with your primary care physician please Get Medicines reviewed and adjusted.   Please request your Prim.Perez to go over all Hospital Tests and Procedure/Radiological results at the follow up, please get all Hospital records sent to your Prim Perez by signing hospital release before you go home.   If you experience worsening of your admission symptoms, develop shortness  of breath, life threatening emergency, suicidal or homicidal thoughts you must seek medical attention immediately by calling 911 or calling your Perez immediately  if symptoms less severe.  You Must read complete instructions/literature along with all the possible adverse reactions/side effects for all the Medicines you take and that have been prescribed to you. Take any new Medicines after you have completely understood and accpet all the possible adverse reactions/side effects.   Do not drive, operating heavy machinery, perform activities at heights, swimming or participation in water activities or provide baby sitting services if your were admitted for syncope or siezures until you have seen by Primary Perez or Perez Neurologist and advised to do so again.  Do not drive when taking Pain medications.    Do not take more than prescribed Pain, Sleep and Anxiety Medications  Special Instructions: If you have smoked or chewed Tobacco  in the last 2 yrs please stop smoking, stop any regular Alcohol  and or any Recreational drug use.  Wear Seat belts while driving.   Please note  You were cared for by Perez hospitalist during your hospital stay. If you have any questions about your discharge medications  or the care you received while you were in the hospital after you are discharged, you can call the unit and asked to speak with the hospitalist on call if the hospitalist that took care of you is not available. Once you are discharged, your primary care physician will handle any further medical issues. Please note that NO REFILLS for any discharge medications will be authorized once you are discharged, as it is imperative that you return to your primary care physician (or establish Perez relationship with Perez primary care physician if you do not have one) for your aftercare needs so that they can reassess your need for medications and monitor your lab values.     Increase activity slowly    Complete by:  As directed             Medication List    STOP taking these medications        lisinopril-hydrochlorothiazide 10-12.5 MG per tablet  Commonly known as:  PRINZIDE,ZESTORETIC      TAKE these medications        acetaminophen 325 MG tablet  Commonly known as:  TYLENOL  Take 2 tablets (650 mg total) by mouth every 6 (six) hours as needed for mild pain or moderate pain (or Fever >/= 101).     ciprofloxacin 500 MG tablet  Commonly known as:  CIPRO  Take 1 tablet (500 mg total) by mouth 2 (two) times daily. Take for 1 week     folic acid 1 MG tablet  Commonly known as:  FOLVITE  Take 1 tablet (1 mg total) by mouth daily.     metroNIDAZOLE 500 MG tablet  Commonly known as:  FLAGYL  Take 1 tablet (500 mg total) by mouth 3 (three) times daily. Take for 1 week     multivitamin with minerals Tabs tablet  Take 1 tablet by mouth daily.     nicotine 21 mg/24hr patch  Commonly known as:  NICODERM CQ - dosed in mg/24 hours  Place 1 patch (21 mg total) onto the skin daily.     ondansetron 4 MG tablet  Commonly known as:  ZOFRAN  Take 1 tablet (4 mg total) by mouth every 6 (six) hours as needed for nausea.     thiamine 100 MG tablet  Take 1 tablet (100  mg total) by mouth daily.           Diet and Activity recommendation: See Discharge Instructions above   Consults obtained -  surgery   Major procedures and Radiology Reports - PLEASE review detailed and final reports for all details, in brief -      Ct Abdomen Pelvis W Contrast  02/19/2015   CLINICAL DATA:  Patient with diverticulosis c/o bilat lower quadrant pain, fever 101, WBC: 34,200Prior scan 2 days ago. Concerned for abscess  EXAM: CT ABDOMEN AND PELVIS WITH CONTRAST  TECHNIQUE: Multidetector CT imaging of the abdomen and pelvis was performed using the standard protocol following bolus administration of intravenous contrast.  CONTRAST:  113m OMNIPAQUE IOHEXOL 300 MG/ML  SOLN  COMPARISON:  02/17/2015.  FINDINGS: Gastrointestinal: As noted on prior CT, there is wall thickening of the mid to distal sigmoid colon where there are multiple diverticula. Along the posterior margin of the affected segment of sigmoid colon, there is inflammatory fat stranding and Perez small amount of extraluminal contrast and air consistent with Perez localized perforation. However, there is no evidence of an abscess and there is no free air. Overall, there has been no significant worsening of disease, but no improvement.  There are no other areas of colonic inflammation. There is mild prominence of the proximal small bowel with air-fluid levels suggesting Perez mild diffuse adynamic ileus. Small bowel is otherwise unremarkable. Normal appendix is visualized.  Lung bases:  Clear.  Heart normal size.  Liver: Small area of focal fat accumulation adjacent to the falciform ligament. Otherwise normal.  Spleen, gallbladder, pancreas, adrenal glands:  Unremarkable.  Kidneys, ureters, bladder:  Unremarkable.  Uterus and adnexa: 2.7 cm partly calcified right uterine fibroid. Uterus normal in overall size. No adnexal masses.  Scattered mildly prominent retroperitoneal lymph nodes. No enlarged lymph nodes.  Ascites:  None.  Musculoskeletal: Disc degenerative changes at  L5-S1. No osteoblastic or osteolytic lesions.  IMPRESSION: 1. Sigmoid colon diverticulitis is without significant change from the recent prior study. 2. There is Perez localized perforation with some extraluminal air, but no evidence of an abscess and no free air. 3. No other acute findings.   Electronically Signed   By: DLajean ManesM.D.   On: 02/19/2015 14:47   Ct Abdomen Pelvis W Contrast  02/17/2015   CLINICAL DATA:  Lower pelvic pain for 1 day. Fever. Elevated white blood cell count.  EXAM: CT ABDOMEN AND PELVIS WITH CONTRAST  TECHNIQUE: Multidetector CT imaging of the abdomen and pelvis was performed using the standard protocol following bolus administration of intravenous contrast.  CONTRAST:  25 mL OMNIPAQUE IOHEXOL 300 MG/ML SOLN GIVEN ORALLY, 1058mOMNIPAQUE IOHEXOL 300 MG/ML SOLN GIVEN INTRAVENOUSLY.  COMPARISON:  CT of the abdomen and pelvis dated 05/05/2014  FINDINGS: Lower chest:  Unremarkable.  Hepatobiliary: No masses or other significant abnormality identified.  Pancreas: There is Perez 0.7 by 0.5 cm circumscribed hypoattenuated lesion within the body of the pancreas. The pancreas otherwise appears unremarkable.  Spleen:  Within normal limits in size and appearance.  Adrenal Glands: No masses identified. There is mild diffuse thickening of the left adrenal gland.  Kidneys/Urinary Tract: No evidence of urolithiasis or hydronephrosis. No solid or complex cystic renal masses identified. No masses or calculi seen involving the lower urinary tract.  Stomach/Bowel/Peritoneum: The appendix is normal. Stomach and small bowel are normal. There is diffuse circumferential wall thickening of the descending colon, and Perez even more so of the sigmoid colon. There are several sigmoid  diverticula with significant pericolonic inflammatory changes adjacent to the lateral portion of the distal sigmoid colon where there is fat stranding, small amount of free fluid and Perez gas bubble which is likely extraluminal. (image 60  sequence two).  Vascular/Lymphatic: No pathologically enlarged lymph nodes identified. Heavy calcified atherosclerotic disease of the aorta and common iliac arteries is noted.  Reproductive: The uterus is enlarged and lobular appearing likely due to presence of fibroids. There is Perez partially calcified exophytic sub mucosal fibroid off of the right uterine fundus which measures 3.2 cm in diameter and Perez second apparently intramural fundal fibroid which measures 2.5 cm.  Other:  None.  Musculoskeletal: There is Perez lytic appearing focus within the right side of L3 vertebral body which measures 5 mm in diameter. This is Perez nonspecific finding, which in the absence of history of malignancy likely represents Perez benign finding.  IMPRESSION: Inflammatory changes of the descending and sigmoid colon.  Sigmoid diverticulosis and evidence of diverticulitis, with severe pericolonic inflammatory changes, and possible abscess formation adjacent to the distal sigmoid colon. No drainable fluid collection is seen. Although the appearance is relatively typical for diverticulosis, correlation to colonoscopy may be considered (after resolution of the acute problem), to exclude underlying malignancy.  Leiomyomatous uterus.  7 mm hypoattenuated pancreatic mass. Further evaluation with MRI of the abdomen may be considered.  Atherosclerotic disease of the aorta.   Electronically Signed   By: Fidela Salisbury M.D.   On: 02/17/2015 15:01    Micro Results     Recent Results (from the past 240 hour(s))  C difficile quick scan w PCR reflex     Status: Abnormal   Collection Time: 02/18/15  9:18 PM  Result Value Ref Range Status   C Diff antigen POSITIVE (Perez) NEGATIVE Final   C Diff toxin NEGATIVE NEGATIVE Final   C Diff interpretation   Final    C. difficile present, but toxin not detected. This indicates colonization. In most cases, this does not require treatment. If patient has signs and symptoms consistent with colitis, consider  treatment.  Clostridium Difficile by PCR     Status: None   Collection Time: 02/18/15  9:18 PM  Result Value Ref Range Status   Toxigenic C Difficile by pcr NEGATIVE NEGATIVE Final  C difficile quick scan w PCR reflex     Status: None   Collection Time: 02/19/15  5:10 PM  Result Value Ref Range Status   C Diff antigen NEGATIVE NEGATIVE Final   C Diff toxin NEGATIVE NEGATIVE Final   C Diff interpretation Negative for toxigenic C. difficile  Corrected    Comment: NOTIFIED DUMAS,N AT [id number] ON [id number] BY HOOKER,B CORRECTED ON 08/07 AT 1044: PREVIOUSLY REPORTED AS VALID        Today   Subjective:   Ruth Perez today has no headache,no chest  pain,no new weakness tingling or numbness, feels much  today. Denies any abdominal pain, tolerating soft diet with no nausea or abdominal pain, headache significantly improved.  Objective:   Blood pressure 131/77, pulse 72, temperature 98.7 F (37.1 C), temperature source Oral, resp. rate 20, height 5' 1"  (1.549 m), weight 58.741 kg (129 lb 8 oz), last menstrual period 02/18/2013, SpO2 97 %.  No intake or output data in the 24 hours ending 02/23/15 1006  Exam Awake Alert, Oriented x 3, No new F.N deficits, Normal affect [place].AT,PERRAL Supple Neck,No JVD, No cervical lymphadenopathy appriciated.  Symmetrical Chest wall movement, Good air movement bilaterally, CTAB RRR,No Gallops,Rubs  or new Murmurs, No Parasternal Heave +ve B.Sounds, Abd Soft, Non tender, No organomegaly appriciated, No rebound -guarding or rigidity. No Cyanosis, Clubbing or edema, No new Rash or bruise  Data Review   CBC w Diff:  Lab Results  Component Value Date   WBC 9.8 02/22/2015   WBC 16.6* 02/17/2015   HGB 11.1* 02/22/2015   HGB 13.5 02/17/2015   HCT 32.8* 02/22/2015   HCT 39.6 02/17/2015   PLT 348 02/22/2015   LYMPHOPCT 19 08/31/2014   MONOPCT 9 08/31/2014   EOSPCT 1 08/31/2014   BASOPCT 0 08/31/2014    CMP:  Lab Results  Component Value Date   NA  139 02/21/2015   K 3.5 02/21/2015   CL 109 02/21/2015   CO2 23 02/21/2015   BUN <5* 02/21/2015   CREATININE 0.44 02/21/2015   CREATININE 0.56 08/31/2014   PROT 7.7 02/17/2015   ALBUMIN 4.0 02/17/2015   BILITOT 1.1 02/17/2015   ALKPHOS 66 02/17/2015   AST 38 02/17/2015   ALT 28 02/17/2015  .   Total Time in preparing paper work, data evaluation and todays exam - 35 minutes  Willis Holquin M.D on 02/23/2015 at 10:06 AM  [facility]   Office  [phone]

## 2015-02-28 ENCOUNTER — Ambulatory Visit (INDEPENDENT_AMBULATORY_CARE_PROVIDER_SITE_OTHER): Payer: 59 | Admitting: Emergency Medicine

## 2015-02-28 VITALS — BP 120/72 | HR 68 | Temp 97.7°F | Resp 18 | Ht 61.0 in | Wt 131.0 lb

## 2015-02-28 DIAGNOSIS — K869 Disease of pancreas, unspecified: Secondary | ICD-10-CM | POA: Diagnosis not present

## 2015-02-28 DIAGNOSIS — K8689 Other specified diseases of pancreas: Secondary | ICD-10-CM

## 2015-02-28 DIAGNOSIS — R1030 Lower abdominal pain, unspecified: Secondary | ICD-10-CM

## 2015-02-28 DIAGNOSIS — F17200 Nicotine dependence, unspecified, uncomplicated: Secondary | ICD-10-CM

## 2015-02-28 DIAGNOSIS — Z720 Tobacco use: Secondary | ICD-10-CM

## 2015-02-28 DIAGNOSIS — K5720 Diverticulitis of large intestine with perforation and abscess without bleeding: Secondary | ICD-10-CM

## 2015-02-28 MED ORDER — CIPROFLOXACIN HCL 500 MG PO TABS: 500.0000 mg | ORAL_TABLET | Freq: Two times a day (BID) | ORAL | Status: DC

## 2015-02-28 MED ORDER — METRONIDAZOLE 500 MG PO TABS: 500.0000 mg | ORAL_TABLET | Freq: Three times a day (TID) | ORAL | Status: DC

## 2015-02-28 MED ORDER — LORAZEPAM 0.5 MG PO TABS: ORAL_TABLET | ORAL | Status: DC

## 2015-02-28 NOTE — Progress Notes (Addendum)
Patient ID: Ruth Perez, female   DOB: 1965-10-07, 49 y.o.   MRN: [id number]    This chart was scribed for [name], MD by [facility], medical scribe at [facility].The patient was seen in exam room 13 and the patient's care was started at 9:55 AM.  Chief Complaint:  Chief Complaint  Patient presents with  . Hospitalization Follow-up  . Diverticulitis   HPI: Ruth Perez is a 49 y.o. female who reports to [facility] today for a follow up after a recent hospitalization due to diverticulitis. She was in the hospital for one week and went home five days ago. Feels foggy after which she believes is due to her medications. Also had a headache while in the hospital, several medications tried which did not help. Abdomen has improved, currently on antibiotics and only eating soft foods. CT scan in the hospital showed a possible 7 mm lesion on a pancreas, MRI of her pancreas is scheduled. Currently trying to quit smoking, four drinks Saturday and five yesterday.  Past Medical History  Diagnosis Date  . Hypertension   . Hyperlipidemia   . Arthritis   . Alcohol abuse   . Diverticulitis    Past Surgical History  Procedure Laterality Date  . Tonsillectomy    . Mouth surgery     Social History   Social History  . Marital Status: Unknown    Spouse Name: N/A  . Number of Children: 0  . Years of Education: N/A   Social History Main Topics  . Smoking status: Former Smoker -- 1.00 packs/day for 35 years    Types: Cigarettes  . Smokeless tobacco: Never Used  . Alcohol Use: 29.4 oz/week    49 Standard drinks or equivalent per week  . Drug Use: Yes    Special: Marijuana     Comment: LAST TIME TUESDAY OR WEDNESDAY  . Sexual Activity: Not Asked   Other Topics Concern  . None   Social History Narrative   Family History  Problem Relation Age of Onset  . Heart disease Mother   . Kidney disease Father   . Colon polyps Father   . Heart disease Father   .  Aneurysm Father     AAA  . Neuropathy Father   . Thyroid cancer Mother    Allergies  Allergen Reactions  . Codeine Nausea And Vomiting   Prior to Admission medications   Medication Sig Start Date End Date Taking? Authorizing Provider  ciprofloxacin (CIPRO) 500 MG tablet Take 1 tablet (500 mg total) by mouth 2 (two) times daily. Take for 1 week 02/23/15  Yes Albertine Patricia, MD  folic acid (FOLVITE) 1 MG tablet Take 1 tablet (1 mg total) by mouth daily. 02/23/15  Yes Albertine Patricia, MD  lisinopril-hydrochlorothiazide (PRINZIDE,ZESTORETIC) 10-12.5 MG per tablet Take 1 tablet by mouth daily.   Yes Historical Provider, MD  metroNIDAZOLE (FLAGYL) 500 MG tablet Take 1 tablet (500 mg total) by mouth 3 (three) times daily. Take for 1 week 02/23/15  Yes Albertine Patricia, MD  Multiple Vitamin (MULTIVITAMIN WITH MINERALS) TABS tablet Take 1 tablet by mouth daily. 02/23/15  Yes Albertine Patricia, MD  acetaminophen (TYLENOL) 325 MG tablet Take 2 tablets (650 mg total) by mouth every 6 (six) hours as needed for mild pain or moderate pain (or Fever >/= 101). Patient not taking: Reported on 02/28/2015 02/23/15   Silver Huguenin Elgergawy, MD  nicotine (NICODERM CQ - DOSED IN MG/24  HOURS) 21 mg/24hr patch Place 1 patch (21 mg total) onto the skin daily. Patient not taking: Reported on 02/28/2015 02/23/15   Silver Huguenin Elgergawy, MD  ondansetron (ZOFRAN) 4 MG tablet Take 1 tablet (4 mg total) by mouth every 6 (six) hours as needed for nausea. Patient not taking: Reported on 02/28/2015 02/23/15   Albertine Patricia, MD  thiamine 100 MG tablet Take 1 tablet (100 mg total) by mouth daily. Patient not taking: Reported on 02/28/2015 02/23/15   Silver Huguenin Elgergawy, MD   ROS: The patient denies fevers, chills, night sweats, unintentional weight loss, chest pain, palpitations, wheezing, dyspnea on exertion, nausea, vomiting, abdominal pain, dysuria, hematuria, melena, numbness, weakness, or tingling.  All other systems have  been reviewed and were otherwise negative with the exception of those mentioned in the HPI and as above.    PHYSICAL EXAM: Filed Vitals:   02/28/15 0838  BP: 120/72  Pulse: 68  Temp: 97.7 F (36.5 C)  Resp: 18   Body mass index is 24.76 kg/(m^2).  General: Alert, no acute distress HEENT:  Normocephalic, atraumatic, oropharynx patent. Eye: Juliette Mangle [facility] Cardiovascular:  Regular rate and rhythm, no rubs murmurs or gallops.  No Carotid bruits, radial pulse intact. No pedal edema.  Respiratory: Clear to auscultation bilaterally.  No wheezes, rales, or rhonchi.  No cyanosis, no use of accessory musculature Abdominal: No organomegaly, abdomen is soft and non-tender, positive bowel sounds.  No masses. Musculoskeletal: Gait intact. No edema, tenderness Skin: No rashes. Neurologic: Facial musculature symmetric. Psychiatric: Patient acts appropriately throughout our interaction. Lymphatic: No cervical or submandibular lymphadenopathy Genitourinary/Anorectal: No acute findings  LABS: Results for orders placed or performed during the hospital encounter of 02/17/15  C difficile quick scan w PCR reflex  Result Value Ref Range   C Diff antigen POSITIVE (A) NEGATIVE   C Diff toxin NEGATIVE NEGATIVE   C Diff interpretation      C. difficile present, but toxin not detected. This indicates colonization. In most cases, this does not require treatment. If patient has signs and symptoms consistent with colitis, consider treatment.  C difficile quick scan w PCR reflex  Result Value Ref Range   C Diff antigen NEGATIVE NEGATIVE   C Diff toxin NEGATIVE NEGATIVE   C Diff interpretation Negative for toxigenic C. difficile   Clostridium Difficile by PCR  Result Value Ref Range   Toxigenic C Difficile by pcr NEGATIVE NEGATIVE  Lipase, blood  Result Value Ref Range   Lipase 17 (L) 22 - 51 U/L  Comprehensive metabolic panel  Result Value Ref Range   Sodium 133 (L) 135 - 145 mmol/L   Potassium 3.5 3.5  - 5.1 mmol/L   Chloride 97 (L) 101 - 111 mmol/L   CO2 24 22 - 32 mmol/L   Glucose, Bld 93 65 - 99 mg/dL   BUN 8 6 - 20 mg/dL   Creatinine, Ser 0.55 0.44 - 1.00 mg/dL   Calcium 9.1 8.9 - 10.3 mg/dL   Total Protein 7.7 6.5 - 8.1 g/dL   Albumin 4.0 3.5 - 5.0 g/dL   AST 38 15 - 41 U/L   ALT 28 14 - 54 U/L   Alkaline Phosphatase 66 38 - 126 U/L   Total Bilirubin 1.1 0.3 - 1.2 mg/dL   GFR calc non Af Amer >60 >60 mL/min   GFR calc Af Amer >60 >60 mL/min   Anion gap 12 5 - 15  Urinalysis, Routine w reflex microscopic (not at [facility])  Result  Value Ref Range   Color, Urine YELLOW YELLOW   APPearance CLEAR CLEAR   Specific Gravity, Urine >1.046 (H) 1.005 - 1.030   pH 6.0 5.0 - 8.0   Glucose, UA NEGATIVE NEGATIVE mg/dL   Hgb urine dipstick MODERATE (A) NEGATIVE   Bilirubin Urine NEGATIVE NEGATIVE   Ketones, ur NEGATIVE NEGATIVE mg/dL   Protein, ur NEGATIVE NEGATIVE mg/dL   Urobilinogen, UA 0.2 0.0 - 1.0 mg/dL   Nitrite NEGATIVE NEGATIVE   Leukocytes, UA NEGATIVE NEGATIVE  Urine microscopic-add on  Result Value Ref Range   RBC / HPF 7-10 <3 RBC/hpf  Lactic acid, plasma  Result Value Ref Range   Lactic Acid, Venous 1.0 0.5 - 2.0 mmol/L  Lactic acid, plasma  Result Value Ref Range   Lactic Acid, Venous 0.7 0.5 - 2.0 mmol/L  CBC  Result Value Ref Range   WBC 15.5 (H) 4.0 - 10.5 K/uL   RBC 3.85 (L) 3.87 - 5.11 MIL/uL   Hemoglobin 12.3 12.0 - 15.0 g/dL   HCT 35.9 (L) 36.0 - 46.0 %   MCV 93.2 78.0 - 100.0 fL   MCH 31.9 26.0 - 34.0 pg   MCHC 34.3 30.0 - 36.0 g/dL   RDW 13.6 11.5 - 15.5 %   Platelets 256 150 - 400 K/uL  CBC  Result Value Ref Range   WBC 13.3 (H) 4.0 - 10.5 K/uL   RBC 3.90 3.87 - 5.11 MIL/uL   Hemoglobin 12.3 12.0 - 15.0 g/dL   HCT 36.7 36.0 - 46.0 %   MCV 94.1 78.0 - 100.0 fL   MCH 31.5 26.0 - 34.0 pg   MCHC 33.5 30.0 - 36.0 g/dL   RDW 13.7 11.5 - 15.5 %   Platelets 258 150 - 400 K/uL  Basic metabolic panel  Result Value Ref Range   Sodium 138 135 - 145  mmol/L   Potassium 3.8 3.5 - 5.1 mmol/L   Chloride 105 101 - 111 mmol/L   CO2 25 22 - 32 mmol/L   Glucose, Bld 130 (H) 65 - 99 mg/dL   BUN 5 (L) 6 - 20 mg/dL   Creatinine, Ser 0.60 0.44 - 1.00 mg/dL   Calcium 8.9 8.9 - 10.3 mg/dL   GFR calc non Af Amer >60 >60 mL/min   GFR calc Af Amer >60 >60 mL/min   Anion gap 8 5 - 15  CBC  Result Value Ref Range   WBC 13.2 (H) 4.0 - 10.5 K/uL   RBC 3.61 (L) 3.87 - 5.11 MIL/uL   Hemoglobin 11.1 (L) 12.0 - 15.0 g/dL   HCT 33.8 (L) 36.0 - 46.0 %   MCV 93.6 78.0 - 100.0 fL   MCH 30.7 26.0 - 34.0 pg   MCHC 32.8 30.0 - 36.0 g/dL   RDW 13.6 11.5 - 15.5 %   Platelets 235 150 - 400 K/uL  CBC  Result Value Ref Range   WBC 14.2 (H) 4.0 - 10.5 K/uL   RBC 3.32 (L) 3.87 - 5.11 MIL/uL   Hemoglobin 10.4 (L) 12.0 - 15.0 g/dL   HCT 30.7 (L) 36.0 - 46.0 %   MCV 92.5 78.0 - 100.0 fL   MCH 31.3 26.0 - 34.0 pg   MCHC 33.9 30.0 - 36.0 g/dL   RDW 13.5 11.5 - 15.5 %   Platelets 230 150 - 400 K/uL  CBC  Result Value Ref Range   WBC 9.4 4.0 - 10.5 K/uL   RBC 3.21 (L) 3.87 - 5.11 MIL/uL  Hemoglobin 10.0 (L) 12.0 - 15.0 g/dL   HCT 29.8 (L) 36.0 - 46.0 %   MCV 92.8 78.0 - 100.0 fL   MCH 31.2 26.0 - 34.0 pg   MCHC 33.6 30.0 - 36.0 g/dL   RDW 13.7 11.5 - 15.5 %   Platelets 266 150 - 400 K/uL  Basic metabolic panel  Result Value Ref Range   Sodium 139 135 - 145 mmol/L   Potassium 3.5 3.5 - 5.1 mmol/L   Chloride 109 101 - 111 mmol/L   CO2 23 22 - 32 mmol/L   Glucose, Bld 90 65 - 99 mg/dL   BUN <5 (L) 6 - 20 mg/dL   Creatinine, Ser 0.44 0.44 - 1.00 mg/dL   Calcium 8.8 (L) 8.9 - 10.3 mg/dL   GFR calc non Af Amer >60 >60 mL/min   GFR calc Af Amer >60 >60 mL/min   Anion gap 7 5 - 15  CBC  Result Value Ref Range   WBC 9.8 4.0 - 10.5 K/uL   RBC 3.50 (L) 3.87 - 5.11 MIL/uL   Hemoglobin 11.1 (L) 12.0 - 15.0 g/dL   HCT 32.8 (L) 36.0 - 46.0 %   MCV 93.7 78.0 - 100.0 fL   MCH 31.7 26.0 - 34.0 pg   MCHC 33.8 30.0 - 36.0 g/dL   RDW 13.9 11.5 - 15.5 %    Platelets 348 150 - 400 K/uL   EKG/XRAY:   Primary read interpreted by Dr. Everlene Farrier at [facility].  ASSESSMENT/PLAN: Patient improving from her diverticulitis. I did tell her to continue the anabolic's for 1 more week. She has not been smoking much but did drink this weekend. I told her she absolutely cannot drink with the Flagyl. She understands.I personally performed the services described in this documentation, which was scribed in my presence. The recorded information has been reviewed and is accurate.  [name], MD  Gross sideeffects, risk and benefits, and alternatives of medications d/w patient. Patient is aware that all medications have potential sideeffects and we are unable to predict every sideeffect or drug-drug interaction that may occur.    Arlyss Queen MD 02/28/2015 9:30 AM

## 2015-02-28 NOTE — Patient Instructions (Signed)

## 2015-03-01 ENCOUNTER — Telehealth: Payer: Self-pay

## 2015-03-01 DIAGNOSIS — K8689 Other specified diseases of pancreas: Secondary | ICD-10-CM

## 2015-03-01 NOTE — Telephone Encounter (Signed)
The following was copied from a referral note:  See if we can get this authorized. Thank you.         ----- Message -----     From: Perlie Gold     Sent: 02/25/2015 10:48 AM      To: Darlyne Russian, MD          I contacted [employer] to explain why we were requesting patient to have this procedure.Per [facility] they need more clinical information. I have faxed over notes to [phone] for review. Per Case Manager it is better for a provider to call in to do a peer to peer review at [phone] option #4. The case number is #[id number], [facility] ID# [id number].    Dr Everlene Farrier asked me to try to get this approved. Called [facility] and it requires a peer-to-peer, can not be approved w/me giving info. I was able to get Elmyra Ricks to do peer-to-peer in Dr Perfecto Kingdom absence and will forward this to her for notes.

## 2015-03-01 NOTE — Telephone Encounter (Signed)
Peer-to-peer completed. Will change to MRI with and without contrast. Nurse will call back in 1 hour with authorization number.

## 2015-03-07 ENCOUNTER — Ambulatory Visit (INDEPENDENT_AMBULATORY_CARE_PROVIDER_SITE_OTHER): Payer: 59 | Admitting: Emergency Medicine

## 2015-03-07 VITALS — BP 108/74 | HR 77 | Temp 97.8°F | Resp 16 | Ht 61.0 in | Wt 129.8 lb

## 2015-03-07 DIAGNOSIS — K869 Disease of pancreas, unspecified: Secondary | ICD-10-CM

## 2015-03-07 DIAGNOSIS — Z720 Tobacco use: Secondary | ICD-10-CM

## 2015-03-07 DIAGNOSIS — F17200 Nicotine dependence, unspecified, uncomplicated: Secondary | ICD-10-CM

## 2015-03-07 DIAGNOSIS — K5720 Diverticulitis of large intestine with perforation and abscess without bleeding: Secondary | ICD-10-CM

## 2015-03-07 DIAGNOSIS — B379 Candidiasis, unspecified: Secondary | ICD-10-CM

## 2015-03-07 DIAGNOSIS — K8689 Other specified diseases of pancreas: Secondary | ICD-10-CM

## 2015-03-07 MED ORDER — TERCONAZOLE 0.8 % VA CREA: 1.0000 | TOPICAL_CREAM | Freq: Every day | VAGINAL | Status: DC

## 2015-03-07 NOTE — Progress Notes (Addendum)
Patient ID: Ruth Perez, female   DOB: October 04, 1965, 49 y.o.   MRN: [id number]    This chart was scribed for Ruth Jordan, MD by [facility], medical scribe at [facility].The patient was seen in exam room 14 and the patient's care was started at 10:37 AM.  Chief Complaint:  Chief Complaint  Patient presents with  . Follow-up    Diverticulitis   HPI: Ruth Perez is a 49 y.o. female who reports to [facility] today for a follow up for diverticulitis, hospital admission on 02/17/2015. Today, she feels foggy because of the medication. She rates the pain of her abdomen, 2/10. Able to tolerate foods. She also complains of a vagina itch. Not drinking alcohol or smoking cigarettes. Returned to work.  Past Medical History  Diagnosis Date  . Hypertension   . Hyperlipidemia   . Arthritis   . Alcohol abuse   . Diverticulitis    Past Surgical History  Procedure Laterality Date  . Tonsillectomy    . Mouth surgery     Social History   Social History  . Marital Status: Unknown    Spouse Name: N/A  . Number of Children: 0  . Years of Education: N/A   Social History Main Topics  . Smoking status: Former Smoker -- 1.00 packs/day for 35 years    Types: Cigarettes  . Smokeless tobacco: Never Used  . Alcohol Use: 29.4 oz/week    49 Standard drinks or equivalent per week  . Drug Use: Yes    Special: Marijuana     Comment: LAST TIME TUESDAY OR WEDNESDAY  . Sexual Activity: Not Asked   Other Topics Concern  . None   Social History Narrative   Family History  Problem Relation Age of Onset  . Heart disease Mother   . Kidney disease Father   . Colon polyps Father   . Heart disease Father   . Aneurysm Father     AAA  . Neuropathy Father   . Thyroid cancer Mother    Allergies  Allergen Reactions  . Codeine Nausea And Vomiting   Prior to Admission medications   Medication Sig Start Date End Date Taking? Authorizing Provider  ciprofloxacin (CIPRO) 500 MG  tablet Take 1 tablet (500 mg total) by mouth 2 (two) times daily. Take for 1 week 02/28/15  Yes Darlyne Russian, MD  folic acid (FOLVITE) 1 MG tablet Take 1 tablet (1 mg total) by mouth daily. 02/23/15  Yes Albertine Patricia, MD  lisinopril-hydrochlorothiazide (PRINZIDE,ZESTORETIC) 10-12.5 MG per tablet Take 1 tablet by mouth daily.   Yes Historical Provider, MD  LORazepam (ATIVAN) 0.5 MG tablet One to 2 tablets as needed for stress every 8 hours 02/28/15  Yes Darlyne Russian, MD  metroNIDAZOLE (FLAGYL) 500 MG tablet Take 1 tablet (500 mg total) by mouth 3 (three) times daily. Take for 1 week 02/28/15  Yes Darlyne Russian, MD  Multiple Vitamin (MULTIVITAMIN WITH MINERALS) TABS tablet Take 1 tablet by mouth daily. 02/23/15  Yes Albertine Patricia, MD  acetaminophen (TYLENOL) 325 MG tablet Take 2 tablets (650 mg total) by mouth every 6 (six) hours as needed for mild pain or moderate pain (or Fever >/= 101). Patient not taking: Reported on 02/28/2015 02/23/15   Silver Huguenin Elgergawy, MD  nicotine (NICODERM CQ - DOSED IN MG/24 HOURS) 21 mg/24hr patch Place 1 patch (21 mg total) onto the skin daily. Patient not taking: Reported on 02/28/2015 02/23/15  Albertine Patricia, MD  ondansetron (ZOFRAN) 4 MG tablet Take 1 tablet (4 mg total) by mouth every 6 (six) hours as needed for nausea. Patient not taking: Reported on 02/28/2015 02/23/15   Albertine Patricia, MD  thiamine 100 MG tablet Take 1 tablet (100 mg total) by mouth daily. Patient not taking: Reported on 02/28/2015 02/23/15   Silver Huguenin Elgergawy, MD   ROS: The patient denies fevers, chills, night sweats, unintentional weight loss, chest pain, palpitations, wheezing, dyspnea on exertion, nausea, vomiting, abdominal pain, dysuria, hematuria, melena, numbness, weakness, or tingling.   All other systems have been reviewed and were otherwise negative with the exception of those mentioned in the HPI and as above.    PHYSICAL EXAM: Filed Vitals:   03/07/15 1025  BP: 108/74   Pulse: 77  Temp: 97.8 F (36.6 C)  Resp: 16   Body mass index is 24.54 kg/(m^2).  General: Alert, no acute distress HEENT:  Normocephalic, atraumatic, oropharynx patent. Eye: [facility] Mangle [facility] Cardiovascular:  Regular rate and rhythm, no rubs murmurs or gallops.  No Carotid bruits, radial pulse intact. No pedal edema.  Respiratory: Clear to auscultation bilaterally.  No wheezes, rales, or rhonchi.  No cyanosis, no use of accessory musculature Abdominal: No organomegaly, abdomen is soft and non-tender, positive bowel sounds.  No masses. Musculoskeletal: Gait intact. No edema, tenderness Skin: No rashes. Neurologic: Facial musculature symmetric. Psychiatric: Patient acts appropriately throughout our interaction. Lymphatic: No cervical or submandibular lymphadenopathy  LABS: Results for orders placed or performed during the hospital encounter of 02/17/15  C difficile quick scan w PCR reflex  Result Value Ref Range   C Diff antigen POSITIVE (A) NEGATIVE   C Diff toxin NEGATIVE NEGATIVE   C Diff interpretation      C. difficile present, but toxin not detected. This indicates colonization. In most cases, this does not require treatment. If patient has signs and symptoms consistent with colitis, consider treatment.  C difficile quick scan w PCR reflex  Result Value Ref Range   C Diff antigen NEGATIVE NEGATIVE   C Diff toxin NEGATIVE NEGATIVE   C Diff interpretation Negative for toxigenic C. difficile   Clostridium Difficile by PCR  Result Value Ref Range   Toxigenic C Difficile by pcr NEGATIVE NEGATIVE  Lipase, blood  Result Value Ref Range   Lipase 17 (L) 22 - 51 U/L  Comprehensive metabolic panel  Result Value Ref Range   Sodium 133 (L) 135 - 145 mmol/L   Potassium 3.5 3.5 - 5.1 mmol/L   Chloride 97 (L) 101 - 111 mmol/L   CO2 24 22 - 32 mmol/L   Glucose, Bld 93 65 - 99 mg/dL   BUN 8 6 - 20 mg/dL   Creatinine, Ser 0.55 0.44 - 1.00 mg/dL   Calcium 9.1 8.9 - 10.3 mg/dL   Total  Protein 7.7 6.5 - 8.1 g/dL   Albumin 4.0 3.5 - 5.0 g/dL   AST 38 15 - 41 U/L   ALT 28 14 - 54 U/L   Alkaline Phosphatase 66 38 - 126 U/L   Total Bilirubin 1.1 0.3 - 1.2 mg/dL   GFR calc non Af Amer >60 >60 mL/min   GFR calc Af Amer >60 >60 mL/min   Anion gap 12 5 - 15  Urinalysis, Routine w reflex microscopic (not at [facility])  Result Value Ref Range   Color, Urine YELLOW YELLOW   APPearance CLEAR CLEAR   Specific Gravity, Urine >1.046 (H) 1.005 - 1.030  pH 6.0 5.0 - 8.0   Glucose, UA NEGATIVE NEGATIVE mg/dL   Hgb urine dipstick MODERATE (A) NEGATIVE   Bilirubin Urine NEGATIVE NEGATIVE   Ketones, ur NEGATIVE NEGATIVE mg/dL   Protein, ur NEGATIVE NEGATIVE mg/dL   Urobilinogen, UA 0.2 0.0 - 1.0 mg/dL   Nitrite NEGATIVE NEGATIVE   Leukocytes, UA NEGATIVE NEGATIVE  Urine microscopic-add on  Result Value Ref Range   RBC / HPF 7-10 <3 RBC/hpf  Lactic acid, plasma  Result Value Ref Range   Lactic Acid, Venous 1.0 0.5 - 2.0 mmol/L  Lactic acid, plasma  Result Value Ref Range   Lactic Acid, Venous 0.7 0.5 - 2.0 mmol/L  CBC  Result Value Ref Range   WBC 15.5 (H) 4.0 - 10.5 K/uL   RBC 3.85 (L) 3.87 - 5.11 MIL/uL   Hemoglobin 12.3 12.0 - 15.0 g/dL   HCT 35.9 (L) 36.0 - 46.0 %   MCV 93.2 78.0 - 100.0 fL   MCH 31.9 26.0 - 34.0 pg   MCHC 34.3 30.0 - 36.0 g/dL   RDW 13.6 11.5 - 15.5 %   Platelets 256 150 - 400 K/uL  CBC  Result Value Ref Range   WBC 13.3 (H) 4.0 - 10.5 K/uL   RBC 3.90 3.87 - 5.11 MIL/uL   Hemoglobin 12.3 12.0 - 15.0 g/dL   HCT 36.7 36.0 - 46.0 %   MCV 94.1 78.0 - 100.0 fL   MCH 31.5 26.0 - 34.0 pg   MCHC 33.5 30.0 - 36.0 g/dL   RDW 13.7 11.5 - 15.5 %   Platelets 258 150 - 400 K/uL  Basic metabolic panel  Result Value Ref Range   Sodium 138 135 - 145 mmol/L   Potassium 3.8 3.5 - 5.1 mmol/L   Chloride 105 101 - 111 mmol/L   CO2 25 22 - 32 mmol/L   Glucose, Bld 130 (H) 65 - 99 mg/dL   BUN 5 (L) 6 - 20 mg/dL   Creatinine, Ser 0.60 0.44 - 1.00 mg/dL   Calcium  8.9 8.9 - 10.3 mg/dL   GFR calc non Af Amer >60 >60 mL/min   GFR calc Af Amer >60 >60 mL/min   Anion gap 8 5 - 15  CBC  Result Value Ref Range   WBC 13.2 (H) 4.0 - 10.5 K/uL   RBC 3.61 (L) 3.87 - 5.11 MIL/uL   Hemoglobin 11.1 (L) 12.0 - 15.0 g/dL   HCT 33.8 (L) 36.0 - 46.0 %   MCV 93.6 78.0 - 100.0 fL   MCH 30.7 26.0 - 34.0 pg   MCHC 32.8 30.0 - 36.0 g/dL   RDW 13.6 11.5 - 15.5 %   Platelets 235 150 - 400 K/uL  CBC  Result Value Ref Range   WBC 14.2 (H) 4.0 - 10.5 K/uL   RBC 3.32 (L) 3.87 - 5.11 MIL/uL   Hemoglobin 10.4 (L) 12.0 - 15.0 g/dL   HCT 30.7 (L) 36.0 - 46.0 %   MCV 92.5 78.0 - 100.0 fL   MCH 31.3 26.0 - 34.0 pg   MCHC 33.9 30.0 - 36.0 g/dL   RDW 13.5 11.5 - 15.5 %   Platelets 230 150 - 400 K/uL  CBC  Result Value Ref Range   WBC 9.4 4.0 - 10.5 K/uL   RBC 3.21 (L) 3.87 - 5.11 MIL/uL   Hemoglobin 10.0 (L) 12.0 - 15.0 g/dL   HCT 29.8 (L) 36.0 - 46.0 %   MCV 92.8 78.0 - 100.0 fL  MCH 31.2 26.0 - 34.0 pg   MCHC 33.6 30.0 - 36.0 g/dL   RDW 13.7 11.5 - 15.5 %   Platelets 266 150 - 400 K/uL  Basic metabolic panel  Result Value Ref Range   Sodium 139 135 - 145 mmol/L   Potassium 3.5 3.5 - 5.1 mmol/L   Chloride 109 101 - 111 mmol/L   CO2 23 22 - 32 mmol/L   Glucose, Bld 90 65 - 99 mg/dL   BUN <5 (L) 6 - 20 mg/dL   Creatinine, Ser 0.44 0.44 - 1.00 mg/dL   Calcium 8.8 (L) 8.9 - 10.3 mg/dL   GFR calc non Af Amer >60 >60 mL/min   GFR calc Af Amer >60 >60 mL/min   Anion gap 7 5 - 15  CBC  Result Value Ref Range   WBC 9.8 4.0 - 10.5 K/uL   RBC 3.50 (L) 3.87 - 5.11 MIL/uL   Hemoglobin 11.1 (L) 12.0 - 15.0 g/dL   HCT 32.8 (L) 36.0 - 46.0 %   MCV 93.7 78.0 - 100.0 fL   MCH 31.7 26.0 - 34.0 pg   MCHC 33.8 30.0 - 36.0 g/dL   RDW 13.9 11.5 - 15.5 %   Platelets 348 150 - 400 K/uL    ASSESSMENT/PLAN: She will finish out a three-week course of anti-biotics. I will check her back in 2 weeks. We went through dietary precautions. She is aware that she could still  perforate her diverticulitis. She was cautioned regarding when to present back to the office. She knows if she develops any pain to come in immediately. She has been off her alcohol and off cigarettes since last seen. Gross sideeffects, risk and benefits, and alternatives of medications d/w patient. Patient is aware that all medications have potential sideeffects and we are unable to predict every sideeffect or drug-drug interaction that may occur. I gave her Terazol cream to use at that time and instructed her to use yogurt twice a day. I personally performed the services described in this documentation, which was scribed in my presence. The recorded information has been reviewed and is accurate.  Ruth Jordan, MD   Arlyss Queen MD 03/07/2015 10:37 AM

## 2015-03-07 NOTE — Patient Instructions (Signed)

## 2015-03-08 ENCOUNTER — Ambulatory Visit: Payer: 59 | Admitting: Emergency Medicine

## 2015-03-10 ENCOUNTER — Encounter: Payer: 59 | Admitting: Emergency Medicine

## 2015-03-17 ENCOUNTER — Ambulatory Visit
Admission: RE | Admit: 2015-03-17 | Discharge: 2015-03-17 | Disposition: A | Discharge status: Home / Self Care | Payer: 59 | Source: Ambulatory Visit | Attending: Physician Assistant | Admitting: Physician Assistant

## 2015-03-17 DIAGNOSIS — K8689 Other specified diseases of pancreas: Secondary | ICD-10-CM

## 2015-03-17 MED ORDER — GADOBENATE DIMEGLUMINE 529 MG/ML IV SOLN
12.0000 mL | Freq: Once | INTRAVENOUS | Status: DC | PRN
Start: 2015-03-17 — End: 2015-03-18

## 2015-03-19 ENCOUNTER — Telehealth: Payer: Self-pay

## 2015-03-19 NOTE — Telephone Encounter (Signed)
The patient called to request the results from her MRI done on Thursday 03/17/15.  This was an MRI of her pancreas which had shown a 84mm mass per CT.  The patient may be reached at [phone].

## 2015-03-21 ENCOUNTER — Other Ambulatory Visit: Payer: Self-pay | Admitting: Emergency Medicine

## 2015-03-21 ENCOUNTER — Telehealth: Payer: Self-pay | Admitting: Emergency Medicine

## 2015-03-21 DIAGNOSIS — K8689 Other specified diseases of pancreas: Secondary | ICD-10-CM

## 2015-03-21 NOTE — Telephone Encounter (Signed)
IMPRESSION: 1. There is a T2 hyperintense structure within the tail of pancreas. Differential considerations include small pseudocyst, branch duct type IPMN, or serous cystadenoma. A followup examination in 12 months is recommended to confirm stability. At this time the study of choice would be a contrast enhanced pancreas protocol MRI.  Can you check on this for her ??

## 2015-03-21 NOTE — Telephone Encounter (Signed)
I called and spoke with patient. She will see me in 2-4 weeks. Referral made to Dr. Ardis Hughs for his opinion. It appears she will need a scan in one year.

## 2015-03-21 NOTE — Telephone Encounter (Signed)
Please see result.  Philis Fendt, MS, PA-C   8:23 AM, 03/21/2015

## 2015-03-24 ENCOUNTER — Telehealth: Payer: Self-pay

## 2015-03-24 NOTE — Telephone Encounter (Signed)
Left message on machine to call back  

## 2015-03-24 NOTE — Telephone Encounter (Signed)
This is a pretty innocent appearing cyst (per the MRI report). She needs rov with me, next available appt to discuss surveillance. Meets criteria for 1 year MRI follow up.        Thanks            ----- Message -----     From: Barron Alvine, CMA     Sent: 03/23/2015 10:18 PM      To: Milus Banister, MD                ----- Message -----     From: Marvel Plan     Sent: 03/23/2015  3:41 PM      To: Barron Alvine, [place]        Referral for Pancreatic Mass - Referring provider requests Dr. Ardis Hughs. Advise.

## 2015-03-29 NOTE — Telephone Encounter (Signed)
Pt is scheduled to see Dr Ardis Hughs on 04/18/15

## 2015-04-18 ENCOUNTER — Ambulatory Visit (INDEPENDENT_AMBULATORY_CARE_PROVIDER_SITE_OTHER): Payer: 59 | Admitting: Gastroenterology

## 2015-04-18 ENCOUNTER — Encounter: Payer: Self-pay | Admitting: Gastroenterology

## 2015-04-18 VITALS — BP 112/60 | HR 84 | Ht 60.0 in | Wt 135.6 lb

## 2015-04-18 DIAGNOSIS — K862 Cyst of pancreas: Secondary | ICD-10-CM

## 2015-04-18 NOTE — Patient Instructions (Addendum)
MRI of pancreas in 03/2016 to follow up tail of pancreas cyst. You should try to cut back daily alcohol intake. Congratulations for quitting smoking.

## 2015-04-18 NOTE — Progress Notes (Signed)
Review of pertinent gastrointestinal problems: 1. Alcohol abuse, overuse (drinks 6-10 beers daily) 2. Adenomatous colon polyps:  Screening colonoscopy Dr. Ardis Hughs 06/2014 found 4 polyps, 3 were adenomatous, recommended recall at 3 year interval.  HPI: This is  Very pleasant 49 year old woman    who was referred to me by Ruth Russian, MD  to evaluate   Abnormal pancreas .    Chief complaint is  Abnormal pancreas on recent CAT scan and MRI  Was having fevers, lower abd pains particular left sided.   She eventually went to the emergency room and was found to have sigmoid diverticulitis. Her white blood cell count was elevated at 16,000.   See her imaging studies but below. I reviewed the reports as well as the images personally  CT scan 02/2015 IMPRESSION:Inflammatory changes of the descending and sigmoid colon.Sigmoid diverticulosis and evidence of diverticulitis, with severepericolonic inflammatory changes, and possible abscess formationadjacent to the distal sigmoid colon. No drainable fluid collection is seen. Although the appearance is relatively typical for diverticulosis, correlation to colonoscopy may be considered (afteresolution of the acute problem), to exclude underlying malignancy. Leiomyomatous uterus. 7 mm hypoattenuated pancreatic mass. Further evaluation with MRI o the abdomen may be considered.Atherosclerotic disease of the aorta.  MRI 03/2015: IMPRESSION: 1. There is a T2 hyperintense structure within the tail of pancreas. Differential considerations include small pseudocyst, branch duct type IPMN, or serous cystadenoma. A followup examination in 12 months is recommended to confirm stability. At this time the study of choice would be a contrast enhanced pancreas protocol MRI.   Was in hosp for 1 weeks, eventual discharge and stayed on antibiotics for 2-3 more weeks.     She gradually improved at home although she did say she had some bowel related issues with the aunt the biotics.  Those have resolved.  Currently no fevers, had some mild left sided pains yesterday.   she is still having trouble quitting alcohol, drinking 5-7 beers daily. She did however quit smoking cigarettes   She has never been told she had pancreas problems before, her weight has been stable, no family history of pancreatic disease.  Review of systems: Pertinent positive and negative review of systems were noted in the above HPI section. Complete review of systems was performed and was otherwise normal.   Past Medical History  Diagnosis Date  . Hypertension   . Hyperlipidemia   . Arthritis   . Alcohol abuse   . Diverticulitis     Past Surgical History  Procedure Laterality Date  . Tonsillectomy    . Mouth surgery      Current Outpatient Prescriptions  Medication Sig Dispense Refill  . lisinopril-hydrochlorothiazide (PRINZIDE,ZESTORETIC) 10-12.5 MG per tablet Take 1 tablet by mouth daily.    Marland Kitchen LORazepam (ATIVAN) 0.5 MG tablet One to 2 tablets as needed for stress every 8 hours (Patient taking differently: One to 2 tablets as needed for stress every 8 hours prn) 30 tablet 0  . Multiple Vitamin (MULTIVITAMIN WITH MINERALS) TABS tablet Take 1 tablet by mouth daily. (Patient taking differently: Take 1 tablet by mouth daily as needed. ) 30 tablet 0   No current facility-administered medications for this visit.    Allergies as of 04/18/2015 - Review Complete 04/18/2015  Allergen Reaction Noted  . Codeine Nausea And Vomiting 08/22/2011    Family History  Problem Relation Age of Onset  . Heart disease Mother   . Kidney disease Father   . Colon polyps Father   . Heart  disease Father   . Aneurysm Father     AAA  . Neuropathy Father   . Thyroid cancer Mother     Social History   Social History  . Marital Status: Unknown    Spouse Name: N/A  . Number of Children: 0  . Years of Education: N/A   Occupational History  . Not on file.   Social History Main Topics  . Smoking  status: Former Smoker -- 1.00 packs/day for 35 years    Types: Cigarettes  . Smokeless tobacco: Never Used     Comment: e cig  . Alcohol Use: 29.4 oz/week    49 Standard drinks or equivalent per week  . Drug Use: Yes    Special: Marijuana  . Sexual Activity: Not on file   Other Topics Concern  . Not on file   Social History Narrative     Physical Exam: BP 112/60 mmHg  Pulse 84  Ht 5' (1.524 m)  Wt 135 lb 9.6 oz (61.508 kg)  BMI 26.48 kg/m2  LMP 02/18/2013 Constitutional: generally well-appearing Psychiatric: alert and oriented x3 Eyes: extraocular movements intact Mouth: oral pharynx moist, no lesions Neck: supple no lymphadenopathy Cardiovascular: heart regular rate and rhythm Lungs: clear to auscultation bilaterally Abdomen: soft, nontender, nondistended, no obvious ascites, no peritoneal signs, normal bowel sounds Extremities: no lower extremity edema bilaterally Skin: no lesions on visible extremities   Assessment and plan: 49 y.o. female with   Incidental tail of pancreas cyst without any concerning features on CAT scan and MRI last month   she meets AGA criteria for surveillance imaging for this small asymptomatic tail of pancreas cyst. This is most likely a branch duct IPMN,  Which has 0 to very low malignant potential. I recommended we repeat MRI imaging of the pancreas in about one year.   If no changes then we will follow again until about 5 years and if no significant changes over 5 year. Then surveillance can be stopped. I congratulated her on quitting smoking and reiterated the need for her to try to quit drinking alcohol , abusing alcohol. She said she will do her best. She also knows to call if she has any significant abdominal pains in the future.   Ruth Loffler, MD [facility] 04/18/2015, 1:53 PM  Cc: Ruth Russian, MD

## 2015-04-19 ENCOUNTER — Encounter: Payer: Self-pay | Admitting: Emergency Medicine

## 2015-04-21 ENCOUNTER — Telehealth: Payer: Self-pay | Admitting: Gastroenterology

## 2015-04-21 NOTE — Telephone Encounter (Signed)
Pt wants to make Dr Ardis Hughs aware that she has minimal pain in the lower left quadrant, no fever or other symptoms.  She was advised to call if she worsens or develops fever or any other symptoms.  She will call tomorrow and update on her condition.

## 2015-04-21 NOTE — Telephone Encounter (Signed)
Ok, thanks.

## 2015-09-24 ENCOUNTER — Other Ambulatory Visit: Payer: Self-pay | Admitting: Emergency Medicine

## 2015-12-19 ENCOUNTER — Ambulatory Visit (INDEPENDENT_AMBULATORY_CARE_PROVIDER_SITE_OTHER): Payer: BLUE CROSS/BLUE SHIELD | Admitting: Emergency Medicine

## 2015-12-19 VITALS — BP 118/72 | HR 74 | Temp 98.0°F | Resp 18 | Wt 152.0 lb

## 2015-12-19 DIAGNOSIS — Z1159 Encounter for screening for other viral diseases: Secondary | ICD-10-CM

## 2015-12-19 DIAGNOSIS — I10 Essential (primary) hypertension: Secondary | ICD-10-CM | POA: Diagnosis not present

## 2015-12-19 DIAGNOSIS — Z13220 Encounter for screening for lipoid disorders: Secondary | ICD-10-CM

## 2015-12-19 LAB — POCT URINALYSIS DIP (MANUAL ENTRY)
BILIRUBIN UA: NEGATIVE
GLUCOSE UA: NEGATIVE
Ketones, POC UA: NEGATIVE
Leukocytes, UA: NEGATIVE
Nitrite, UA: NEGATIVE
Protein Ur, POC: NEGATIVE
Spec Grav, UA: 1.01
Urobilinogen, UA: 0.2
pH, UA: 6

## 2015-12-19 LAB — COMPLETE METABOLIC PANEL WITH GFR
ALBUMIN: 4.6 g/dL (ref 3.6–5.1)
ALT: 33 U/L — ABNORMAL HIGH (ref 6–29)
AST: 29 U/L (ref 10–35)
Alkaline Phosphatase: 58 U/L (ref 33–130)
BUN: 12 mg/dL (ref 7–25)
CO2: 25 mmol/L (ref 20–31)
Calcium: 9.8 mg/dL (ref 8.6–10.4)
Chloride: 99 mmol/L (ref 98–110)
Creat: 0.57 mg/dL (ref 0.50–1.05)
GFR, Est African American: >89 mL/min (ref >=60)
Glucose, Bld: 82 mg/dL (ref 65–99)
POTASSIUM: 4.1 mmol/L (ref 3.5–5.3)
Sodium: 138 mmol/L (ref 135–146)
Total Bilirubin: 0.4 mg/dL (ref 0.2–1.2)
Total Protein: 7.6 g/dL (ref 6.1–8.1)

## 2015-12-19 LAB — POCT CBC
GRANULOCYTE PERCENT: 71.5 % (ref 37–80)
HCT, POC: 37.7 % (ref 37.7–47.9)
Hemoglobin: 13.4 g/dL (ref 12.2–16.2)
Lymph, poc: 1.6 (ref 0.6–3.4)
MCH: 31.5 pg — AB (ref 27–31.2)
MCHC: 35.6 g/dL — AB (ref 31.8–35.4)
MCV: 88.6 fL (ref 80–97)
MID (CBC): 0.4 (ref 0–0.9)
MPV: 7.3 fL (ref 0–99.8)
PLATELET COUNT, POC: 292 10*3/uL (ref 142–424)
POC Granulocyte: 4.9 (ref 2–6.9)
POC LYMPH PERCENT: 22.5 %L (ref 10–50)
POC MID %: 6 %M (ref 0–12)
RBC: 4.25 M/uL (ref 4.04–5.48)
RDW, POC: 13.5 %
WBC: 6.9 10*3/uL (ref 4.6–10.2)

## 2015-12-19 LAB — LIPID PANEL
Cholesterol: 290 mg/dL — ABNORMAL HIGH (ref 125–200)
HDL: 109 mg/dL (ref >=46)
LDL Cholesterol: 138 mg/dL — ABNORMAL HIGH (ref <130)
TRIGLYCERIDES: 214 mg/dL — AB (ref <150)
Total CHOL/HDL Ratio: 2.7 Ratio (ref <=5.0)
VLDL: 43 mg/dL — ABNORMAL HIGH (ref <30)

## 2015-12-19 LAB — HEPATITIS C ANTIBODY: HCV Ab: NEGATIVE

## 2015-12-19 MED ORDER — LISINOPRIL-HYDROCHLOROTHIAZIDE 10-12.5 MG PO TABS: 1.0000 | ORAL_TABLET | Freq: Every day | ORAL | Status: DC

## 2015-12-19 NOTE — Patient Instructions (Addendum)
IF you received an x-ray today, you will receive an invoice from [facility]. Please contact [facility] at [phone] with questions or concerns regarding your invoice.   IF you received labwork today, you will receive an invoice from [employer]. Please contact [facility] at [phone] with questions or concerns regarding your invoice.   Our billing staff will not be able to assist you with questions regarding bills from these companies.  You will be contacted with the lab results as soon as they are available. The fastest way to get your results is to activate your My Chart account. Instructions are located on the last page of this paperwork. If you have not heard from [place] regarding the results in 2 weeks, please contact this office.    Please keep the wound clean.  Keep bandaging over the area.  If you take a shower, quickly come out of the dressing and change to a new one.  Leave the packing in place.   DASH Eating Plan DASH stands for "Dietary Approaches to Stop Hypertension." The DASH eating plan is a healthy eating plan that has been shown to reduce high blood pressure (hypertension). Additional health benefits may include reducing the risk of type 2 diabetes mellitus, heart disease, and stroke. The DASH eating plan may also help with weight loss. WHAT DO I NEED TO KNOW ABOUT THE DASH EATING PLAN? For the DASH eating plan, you will follow these general guidelines:  Choose foods with a percent daily value for sodium of less than 5% (as listed on the food label).  Use salt-free seasonings or herbs instead of table salt or sea salt.  Check with your health care provider or pharmacist before using salt substitutes.  Eat lower-sodium products, often labeled as "lower sodium" or "no salt added."  Eat fresh foods.  Eat more vegetables, fruits, and low-fat dairy products.  Choose whole grains. Look for the word "whole" as the first word  in the ingredient list.  Choose fish and skinless chicken or [place] more often than red meat. Limit fish, poultry, and meat to 6 oz (170 g) each day.  Limit sweets, desserts, sugars, and sugary drinks.  Choose heart-healthy fats.  Limit cheese to 1 oz (28 g) per day.  Eat more home-cooked food and less restaurant, buffet, and fast food.  Limit fried foods.  Cook foods using methods other than frying.  Limit canned vegetables. If you do use them, rinse them well to decrease the sodium.  When eating at a restaurant, ask that your food be prepared with less salt, or no salt if possible. WHAT FOODS CAN I EAT? Seek help from a dietitian for individual calorie needs. Grains Whole grain or whole wheat bread. Brown rice. Whole grain or whole wheat pasta. Quinoa, bulgur, and whole grain cereals. Low-sodium cereals. Corn or whole wheat flour tortillas. Whole grain cornbread. Whole grain crackers. Low-sodium crackers. Vegetables Fresh or frozen vegetables (raw, steamed, roasted, or grilled). Low-sodium or reduced-sodium tomato and vegetable juices. Low-sodium or reduced-sodium tomato sauce and paste. Low-sodium or reduced-sodium canned vegetables.  Fruits All fresh, canned (in natural juice), or frozen fruits. Meat and Other Protein Products Ground beef (85% or leaner), grass-fed beef, or beef trimmed of fat. Skinless chicken or [place]. Ground chicken or [place]. Pork trimmed of fat. All fish and seafood. Eggs. Dried beans, peas, or lentils. Unsalted nuts and seeds. Unsalted canned beans. Dairy Low-fat dairy products, such as skim or 1% milk, 2% or reduced-fat  cheeses, low-fat ricotta or cottage cheese, or plain low-fat yogurt. Low-sodium or reduced-sodium cheeses. Fats and Oils Tub margarines without trans fats. Light or reduced-fat mayonnaise and salad dressings (reduced sodium). Avocado. Safflower, olive, or canola oils. Natural peanut or almond butter. Other Unsalted popcorn and  pretzels. The items listed above may not be a complete list of recommended foods or beverages. Contact your dietitian for more options. WHAT FOODS ARE NOT RECOMMENDED? Grains White bread. White pasta. White rice. Refined cornbread. Bagels and croissants. Crackers that contain trans fat. Vegetables Creamed or fried vegetables. Vegetables in a cheese sauce. Regular canned vegetables. Regular canned tomato sauce and paste. Regular tomato and vegetable juices. Fruits Dried fruits. Canned fruit in light or heavy syrup. Fruit juice. Meat and Other Protein Products Fatty cuts of meat. Ribs, chicken wings, bacon, sausage, bologna, salami, chitterlings, fatback, hot dogs, bratwurst, and packaged luncheon meats. Salted nuts and seeds. Canned beans with salt. Dairy Whole or 2% milk, cream, half-and-half, and cream cheese. Whole-fat or sweetened yogurt. Full-fat cheeses or blue cheese. Nondairy creamers and whipped toppings. Processed cheese, cheese spreads, or cheese curds. Condiments Onion and garlic salt, seasoned salt, table salt, and sea salt. Canned and packaged gravies. Worcestershire sauce. Tartar sauce. Barbecue sauce. Teriyaki sauce. Soy sauce, including reduced sodium. Steak sauce. Fish sauce. Oyster sauce. Cocktail sauce. Horseradish. Ketchup and mustard. Meat flavorings and tenderizers. Bouillon cubes. Hot sauce. Tabasco sauce. Marinades. Taco seasonings. Relishes. Fats and Oils Butter, stick margarine, lard, shortening, ghee, and bacon fat. Coconut, palm kernel, or palm oils. Regular salad dressings. Other Pickles and olives. Salted popcorn and pretzels. The items listed above may not be a complete list of foods and beverages to avoid. Contact your dietitian for more information. WHERE CAN I FIND MORE INFORMATION? National Heart, Lung, and Blood Institute: travelstabloid.com   This information is not intended to replace advice given to you by your health care  provider. Make sure you discuss any questions you have with your health care provider.   Document Released: 06/21/2011 Document Revised: 07/23/2014 Document Reviewed: 05/06/2013 Elsevier Interactive Patient Education 2016 Elsevier Inc. Epidermal Cyst An epidermal cyst is usually a small, painless lump under the skin. Cysts often occur on the face, neck, stomach, chest, or genitals. The cyst may be filled with a bad smelling paste. Do not pop your cyst. Popping the cyst can cause pain and puffiness (swelling). HOME CARE   Only take medicines as told by your doctor.  Take your medicine (antibiotics) as told. Finish it even if you start to feel better. GET HELP RIGHT AWAY IF:  Your cyst is tender, red, or puffy.  You are not getting better, or you are getting worse.  You have any questions or concerns. MAKE SURE YOU:  Understand these instructions.  Will watch your condition.  Will get help right away if you are not doing well or get worse.   This information is not intended to replace advice given to you by your health care provider. Make sure you discuss any questions you have with your health care provider.   Document Released: 08/09/2004 Document Revised: 01/01/2012 Document Reviewed: 01/08/2011 Elsevier Interactive Patient Education [employer].

## 2015-12-19 NOTE — Progress Notes (Addendum)
Subjective:  This chart was scribed for Arlyss Queen MD, by Tamsen Roers, at [facility].  This patient was seen in room 1 and the patient's care was started at 10:24 AM.   Chief Complaint  Patient presents with  . Medication Refill    lisinopril     Patient ID: Ruth Perez, female    DOB: [birth year], 50 y.o.   MRN: [id number]  HPI HPI Comments: Ruth Perez is a 50 y.o. female who presents to the [facility] for a medication refill (Lisinopril).  She is very happy with her blood pressure medication and states that it works well for her.  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 She states that everything is going really well and she really enjoys the perks of her new job.  Patient sees Dr. Ardis Hughs (gastoenterologist) regarding her diverticulitis and watches what she eats.  She had a lesion on her pancreas and will have it checked every 5 years.  She has been having hot flashes frequently.  Patient has not been and is very proud of herself.  Her drinking has not changed and she is currently having 6-8 drinks per day.   She is also complaining of an ingrown hair in her right axillary region.   Patient states that she now has gum disease.   She is not up to date with her mammograms and would like to wait a while before having one.    Patient Active Problem List   Diagnosis Date Noted  . Leukocytosis 02/17/2015  . Diverticulosis 02/17/2015  . Fever 02/17/2015  . SIRS (systemic inflammatory response syndrome) ([place]) 02/17/2015  . Alcohol abuse 02/17/2015  . Acute diverticulitis 02/17/2015  . Pancreatic lesion 02/17/2015  . Colonic diverticular abscess 02/17/2015  . Hematuria 03/01/2014  . Smoker unmotivated to quit 04/20/2013  . Essential hypertension 10/09/2012  . Arthritis of neck ([place]) 10/09/2012  . EtOH dependence ([place]) 10/09/2012   Past Medical History  Diagnosis Date  . Hypertension   .  Hyperlipidemia   . Arthritis   . Alcohol abuse   . Diverticulitis    Past Surgical History  Procedure Laterality Date  . Tonsillectomy    . Mouth surgery     Allergies  Allergen Reactions  . Codeine Nausea And Vomiting   Prior to Admission medications   Medication Sig Start Date End Date Taking? Authorizing Provider  lisinopril-hydrochlorothiazide (PRINZIDE,ZESTORETIC) 10-12.5 MG tablet TAKE ONE TABLET BY MOUTH ONCE DAILY 09/25/15  Yes Darlyne Russian, MD  LORazepam (ATIVAN) 0.5 MG tablet One to 2 tablets as needed for stress every 8 hours Patient taking differently: One to 2 tablets as needed for stress every 8 hours prn 02/28/15  Yes Darlyne Russian, MD  Multiple Vitamin (MULTIVITAMIN WITH MINERALS) TABS tablet Take 1 tablet by mouth daily. Patient taking differently: Take 1 tablet by mouth daily as needed.  02/23/15  Yes Albertine Patricia, MD   Social History   Social History  . Marital Status: Unknown    Spouse Name: N/A  . Number of Children: 0  . Years of Education: N/A   Occupational History  . Not on file.   Social History Main Topics  . Smoking status: Former Smoker -- 1.00 packs/day for 35 years    Types: Cigarettes  . Smokeless tobacco: Never Used     Comment: e cig  . Alcohol Use: 29.4 oz/week    49 Standard drinks  or equivalent per week  . Drug Use: Yes    Special: Marijuana  . Sexual Activity: Not on file   Other Topics Concern  . Not on file   Social History Narrative     Review of Systems  Constitutional: Negative for fever and chills.  Eyes: Negative for pain, redness and itching.  Respiratory: Negative for cough, choking and shortness of breath.   Gastrointestinal: Negative for nausea and vomiting.  Musculoskeletal: Negative for back pain, neck pain and neck stiffness.  Neurological: Negative for seizures, syncope and speech difficulty.       Objective:   Physical Exam Filed Vitals:   12/19/15 0937  BP: 118/72  Pulse: 74  Temp: 98 F  (36.7 C)  TempSrc: Oral  Resp: 18  Weight: 152 lb (68.947 kg)  SpO2: 100%    CONSTITUTIONAL: Well developed/well nourished HEAD: Normocephalic/atraumatic EYES: EOMI/PERRL ENMT: Mucous membranes moist NECK: supple no meningeal signs SPINE/BACK:entire spine nontender CV: S1/S2 noted, no murmurs/rubs/gallops noted LUNGS: Lungs are clear to auscultation bilaterally, no apparent distress ABDOMEN: soft, nontender, no rebound or guarding, bowel sounds noted throughout abdomen GU:no cva tenderness NEURO: Pt is awake/alert/appropriate, moves all extremitiesx4.  No facial droop.   EXTREMITIES: pulses normal/equal, full ROM SKIN: warm, color normal there is a one by one 1/2 cm from cystic area right axilla. PSYCH: no abnormalities of mood noted, alert and oriented to situation Results for orders placed or performed in visit on 12/19/15  POCT CBC  Result Value Ref Range   WBC 6.9 4.6 - 10.2 K/uL   Lymph, poc 1.6 0.6 - 3.4   POC LYMPH PERCENT 22.5 10 - 50 %L   MID (cbc) 0.4 0 - 0.9   POC MID % 6.0 0 - 12 %M   POC Granulocyte 4.9 2 - 6.9   Granulocyte percent 71.5 37 - 80 %G   RBC 4.25 4.04 - 5.48 M/uL   Hemoglobin 13.4 12.2 - 16.2 g/dL   HCT, POC 37.7 37.7 - 47.9 %   MCV 88.6 80 - 97 fL   MCH, POC 31.5 (A) 27 - 31.2 pg   MCHC 35.6 (A) 31.8 - 35.4 g/dL   RDW, POC 13.5 %   Platelet Count, POC 292 142 - 424 K/uL   MPV 7.3 0 - 99.8 fL  POCT urinalysis dipstick  Result Value Ref Range   Color, UA yellow yellow   Clarity, UA clear clear   Glucose, UA negative negative   Bilirubin, UA negative negative   Ketones, POC UA negative negative   Spec Grav, UA 1.010    Blood, UA trace-intact (A) negative   pH, UA 6.0    Protein Ur, POC negative negative   Urobilinogen, UA 0.2    Nitrite, UA Negative Negative   Leukocytes, UA Negative Negative      Assessment & Plan:  Blood pressure medications were refilled. She was encouraged that she had quit smoking. She continues to drink heavily  during the week worse on the weekends.We'll go ahead and I&D the cystic area right axilla. Antibiotics not necessary. Advised her to have a mammogram and she declines at present.I personally performed the services described in this documentation, which was scribed in my presence. The recorded information has been reviewed and is accurate.

## 2015-12-20 ENCOUNTER — Telehealth: Payer: Self-pay | Admitting: Emergency Medicine

## 2015-12-20 LAB — TSH: TSH: 0.49 m[IU]/L

## 2015-12-20 NOTE — Telephone Encounter (Signed)
-----   Message from Darlyne Russian, MD sent at 12/20/2015  7:22 AM EDT ----- One liver test is slightly elevated. Cholesterol is very high but I do not want to put her on a statin drug because of the liver toxicity. Please watch her diet.

## 2015-12-20 NOTE — Telephone Encounter (Signed)
Pt given lab results and instructions to watch saturated fatty foods, exercise and diet to improve cholesterol Pt verbalized understanding. States, she is starting diet and will watch food intake

## 2015-12-20 NOTE — Telephone Encounter (Signed)
Left message for pt to call for lab results 

## 2015-12-21 ENCOUNTER — Ambulatory Visit (INDEPENDENT_AMBULATORY_CARE_PROVIDER_SITE_OTHER): Payer: BLUE CROSS/BLUE SHIELD | Admitting: Urgent Care

## 2015-12-21 VITALS — BP 116/80 | HR 83 | Temp 98.1°F | Resp 16 | Ht 59.0 in | Wt 150.0 lb

## 2015-12-21 DIAGNOSIS — L723 Sebaceous cyst: Secondary | ICD-10-CM

## 2015-12-21 MED ORDER — CEPHALEXIN 500 MG PO CAPS: 500.0000 mg | ORAL_CAPSULE | Freq: Three times a day (TID) | ORAL | Status: AC

## 2015-12-21 NOTE — Progress Notes (Signed)
    MRN: [id number] DOB: [birth year]  Subjective:   Ruth Perez is a 50 y.o. female presenting for follow up on I&D.   Patient had incision and drainage of her right axilla on 12/19/2015. She is presenting for f/u on this today. Reports her packing fell out yesterday when she tried to change her dressing. She has not cleaned the area or replaced her dressing. Denies fever, redness, bleeding, pain.  Ruth Perez has a current medication list which includes the following prescription(s): lisinopril-hydrochlorothiazide, lorazepam, and multivitamin with minerals. Also is allergic to codeine.  Ruth Perez  has a past medical history of Hypertension; Hyperlipidemia; Arthritis; Alcohol abuse; and Diverticulitis. Also  has past surgical history that includes Tonsillectomy and Mouth surgery.  Objective:   Vitals: BP 116/80 mmHg  Pulse 83  Temp(Src) 98.1 F (36.7 C) (Oral)  Resp 16  Ht 4\' 11"  (1.499 m)  Wt 150 lb (68.04 kg)  BMI 30.28 kg/m2  SpO2 99%  LMP 02/18/2013  Physical Exam  Constitutional: She is oriented to person, place, and time. She appears well-developed and well-nourished.  Cardiovascular: Normal rate.   Pulmonary/Chest: Effort normal.  Neurological: She is alert and oriented to person, place, and time.  Skin: Skin is warm and dry.       Assessment and Plan :   1. Sebaceous cyst of right axilla - Resolving, offered patient script for Keflex which she will start if she develops signs of infection as discussed in clinic. Patient verbalized understanding.  Jaynee Eagles, PA-C [facility] [phone] 12/21/2015 4:06 PM

## 2015-12-21 NOTE — Patient Instructions (Addendum)
Sebaceous Cyst Removal Sebaceous cyst removal is a procedure to remove a sac of oily material that forms under your skin (sebaceous cyst). Sebaceous cysts may also be called epidermoid cysts or keratin cysts. Normally, the skin secretes this oily material through a gland or a hair follicle. This type of cyst usually results when a skin gland or hair follicle becomes blocked. You may need this procedure if you have a sebaceous cyst that becomes large, uncomfortable, or infected. LET [facility] CARE PROVIDER KNOW ABOUT:  Any allergies you have.  All medicines you are taking, including vitamins, herbs, eye drops, creams, and over-the-counter medicines.  Previous problems you or members of your family have had with the use of anesthetics.  Any blood disorders you have.  Previous surgeries you have had.  Medical conditions you have. RISKS AND COMPLICATIONS Generally, this is a safe procedure. However, problems may occur, including:  Developing another cyst.  Bleeding.  Infection.  Scarring. BEFORE THE PROCEDURE  Ask your health care provider about:  Changing or stopping your regular medicines. This is especially important if you are taking diabetes medicines or blood thinners.  Taking medicines such as aspirin and ibuprofen. These medicines can thin your blood. Do not take these medicines before your procedure if your health care provider instructs you not to.  If you have an infected cyst, you may have to take antibiotic medicines before or after the cyst removal. Take your antibiotics as directed by your health care provider. Finish all of the medicine even if you start to feel better.  Take a shower on the morning of your procedure. Your health care provider may ask you to use a germ-killing (antiseptic) soap. PROCEDURE  You will be given a medicine that numbs the area (local anesthetic).  The skin around the cyst will be cleaned with a germ-killing solution  (antiseptic).  Your health care provider will make a small surgical incision over the cyst.  The cyst will be separated from the surrounding tissues that are under your skin.  If possible, the cyst will be removed undamaged (intact).  If the cyst bursts (ruptures), it will need to be removed in pieces.  After the cyst is removed, your health care provider will control any bleeding and close the incision with small stitches (sutures). Small incisions may not need sutures, and the bleeding will be controlled by applying direct pressure with gauze.  Your health care provider may apply antibiotic ointment and a light bandage (dressing) over the incision. This procedure may vary among health care providers and hospitals. AFTER THE PROCEDURE  If your cyst ruptured during surgery, you may need to take antibiotic medicine. If you were prescribed an antibiotic medicine, finish all of it even if you start to feel better.   This information is not intended to replace advice given to you by your health care provider. Make sure you discuss any questions you have with your health care provider.   Document Released: 06/29/2000 Document Revised: 07/23/2014 Document Reviewed: 03/17/2014 Elsevier Interactive Patient Education 2016 [employer].     IF you received an x-ray today, you will receive an invoice from [facility]. Please contact [facility] at [phone] with questions or concerns regarding your invoice.   IF you received labwork today, you will receive an invoice from [employer]. Please contact [facility] at [phone] with questions or concerns regarding your invoice.   Our billing staff will not be able to assist you with questions regarding bills from  these companies.  You will be contacted with the lab results as soon as they are available. The fastest way to get your results is to activate your My Chart account. Instructions are located  on the last page of this paperwork. If you have not heard from [place] regarding the results in 2 weeks, please contact this office.

## 2016-02-02 ENCOUNTER — Encounter: Payer: Self-pay | Admitting: Gastroenterology

## 2016-02-21 ENCOUNTER — Telehealth: Payer: Self-pay

## 2016-02-21 NOTE — Telephone Encounter (Signed)
Pt is attempting to get a specialty bra made for her bilaterally asymmetrical breasts. Pt states that she was informed that her insurance may cover part or all of the cost if a prescription is written.

## 2016-02-21 NOTE — Telephone Encounter (Signed)
Pt is needing a RX for a specially made bra  Best number [phone]

## 2016-02-22 MED ORDER — UNABLE TO FIND: 0 refills | Status: DC

## 2016-02-22 NOTE — Telephone Encounter (Signed)
I printed Rx and put in your box for signature.

## 2016-02-22 NOTE — Telephone Encounter (Signed)
Okay to print  out a prescription for specialty brawl due to asymmetric breast size.

## 2016-02-23 NOTE — Telephone Encounter (Signed)
Notified pt Rx is ready.

## 2016-05-04 ENCOUNTER — Encounter: Payer: Self-pay | Admitting: Gastroenterology

## 2016-05-04 ENCOUNTER — Ambulatory Visit (INDEPENDENT_AMBULATORY_CARE_PROVIDER_SITE_OTHER): Payer: BLUE CROSS/BLUE SHIELD | Admitting: Gastroenterology

## 2016-05-04 ENCOUNTER — Encounter (INDEPENDENT_AMBULATORY_CARE_PROVIDER_SITE_OTHER): Payer: Self-pay

## 2016-05-04 VITALS — BP 126/70 | HR 92 | Ht 60.0 in | Wt 154.4 lb

## 2016-05-04 DIAGNOSIS — K862 Cyst of pancreas: Secondary | ICD-10-CM

## 2016-05-04 NOTE — Progress Notes (Signed)
Review of pertinent gastrointestinal problems: 1. Alcohol abuse, overuse (drinks 6-10 beers daily) 2. Adenomatous colon polyps:  Screening colonoscopy Dr. Ardis Hughs 06/2014 found 4 polyps, 3 were adenomatous, recommended recall at 3 year interval. 3. Incidental, small pancreatic cyst: discovered 02/2018 by CT (45mm lesion in tail), follow up MRI : MRI 03/2015: IMPRESSION: 1. There is a T2 hyperintense structure within the tail of pancreas. Differential considerations include small pseudocyst, branch duct type IPMN, or serous cystadenoma. I recommended repeat MRI at 1 year interval. 4. Acute diverticulitis 2016 confirmed by CT scan:   HPI: This is a   very pleasant 50 year old woman whom I last saw about a year ago  Chief complaint is pancreatic cyst, history of acute diverticulitis  No signs of recurrent acute diverticulitis.  No overt pancreatitis.  Avg drinking 6-7 beers per day.  She is trying to cut back.  Weight is up 20 pounds in the past 12 months (per same scale here in GI)  ROS: complete GI ROS as described in HPI.  Constitutional:  No unintentional weight loss   Past Medical History:  Diagnosis Date  . Alcohol abuse   . Arthritis   . Diverticulitis   . Hyperlipidemia   . Hypertension   . Pancreatic cyst     Past Surgical History:  Procedure Laterality Date  . MOUTH SURGERY    . TONSILLECTOMY      Current Outpatient Prescriptions  Medication Sig Dispense Refill  . lisinopril-hydrochlorothiazide (PRINZIDE,ZESTORETIC) 10-12.5 MG tablet Take 1 tablet by mouth daily. 90 tablet 3  . LORazepam (ATIVAN) 0.5 MG tablet One to 2 tablets as needed for stress every 8 hours (Patient taking differently: One to 2 tablets as needed for stress every 8 hours prn) 30 tablet 0  . UNABLE TO FIND Specialty bra made to fit asymmetrical breasts. 1 each 0   No current facility-administered medications for this visit.     Allergies as of 05/04/2016 - Review Complete 05/04/2016  Allergen  Reaction Noted  . Codeine Nausea And Vomiting 08/22/2011    Family History  Problem Relation Age of Onset  . Heart disease Mother   . Thyroid cancer Mother   . Kidney disease Father   . Colon polyps Father   . Heart disease Father   . Aneurysm Father     AAA  . Neuropathy Father     Social History   Social History  . Marital status: Unknown    Spouse name: N/A  . Number of children: 0  . Years of education: N/A   Occupational History  . Not on file.   Social History Main Topics  . Smoking status: Former Smoker    Packs/day: 1.00    Years: 35.00    Types: Cigarettes  . Smokeless tobacco: Never Used     Comment: e cig  . Alcohol use 29.4 oz/week    49 Standard drinks or equivalent per week  . Drug use:     Types: Marijuana  . Sexual activity: Not on file   Other Topics Concern  . Not on file   Social History Narrative  . No narrative on file     Physical Exam: BP 126/70 (BP Location: Right Arm, Patient Position: Sitting, Cuff Size: Normal)   Pulse 92   Ht 5' (1.524 m) Comment: height measured without shoes  Wt 154 lb 6 oz (70 kg)   LMP 02/18/2013   BMI 30.15 kg/m  Constitutional: generally well-appearing Psychiatric: alert and oriented x3 Abdomen: soft,  nontender, nondistended, no obvious ascites, no peritoneal signs, normal bowel sounds No peripheral edema noted in lower extremities  Assessment and plan: 50 y.o. female with History of pancreatic cyst, history of acute diverticulitis  She has no signs of recurrent diverticulitis and certainly no signs of acute pancreatitis. She continues to drink, over drink on a daily basis. I recommended she continue to try to cut back. No signs of underlying pancreatic disease clinically. Per surveillance protocol I recommended repeat MRI of the pancreas at this point. If it shows no changes in the small tail of pancreas cyst then I would repeat MRI 2 years from now.   Owens Loffler, MD [place]  [facility] 05/04/2016, 9:16 AM

## 2016-05-04 NOTE — Patient Instructions (Addendum)
MRI of pancreas with MRCP images to follow up for small pancreatic cyst.  You have been scheduled for an MRI at [facility] on 05/10/16. Your appointment time is 8 am. Please arrive 15 minutes prior to your appointment time for registration purposes. Please make certain not to have anything to eat or drink 6 hours prior to your test. In addition, if you have any metal in your body, have a pacemaker or defibrillator, please be sure to let your ordering physician know. This test typically takes 45 minutes to 1 hour to complete.

## 2016-05-10 ENCOUNTER — Ambulatory Visit (HOSPITAL_COMMUNITY)
Admission: RE | Admit: 2016-05-10 | Discharge: 2016-05-10 | Disposition: A | Discharge status: Home / Self Care | Payer: BLUE CROSS/BLUE SHIELD | Source: Ambulatory Visit | Attending: Gastroenterology | Admitting: Gastroenterology

## 2016-05-10 ENCOUNTER — Other Ambulatory Visit: Payer: Self-pay | Admitting: Gastroenterology

## 2016-05-10 DIAGNOSIS — K862 Cyst of pancreas: Secondary | ICD-10-CM | POA: Insufficient documentation

## 2016-05-10 LAB — POCT I-STAT CREATININE: Creatinine, Ser: 0.7 mg/dL (ref 0.44–1.00)

## 2016-05-10 MED ORDER — GADOBENATE DIMEGLUMINE 529 MG/ML IV SOLN
15.0000 mL | Freq: Once | INTRAVENOUS | Status: AC | PRN
Start: 2016-05-10 — End: 2016-05-10
  Administered 2016-05-10: 14 mL via INTRAVENOUS

## 2016-08-10 ENCOUNTER — Ambulatory Visit (INDEPENDENT_AMBULATORY_CARE_PROVIDER_SITE_OTHER): Payer: BLUE CROSS/BLUE SHIELD

## 2016-08-10 ENCOUNTER — Ambulatory Visit (INDEPENDENT_AMBULATORY_CARE_PROVIDER_SITE_OTHER): Payer: BLUE CROSS/BLUE SHIELD | Admitting: Family Medicine

## 2016-08-10 VITALS — BP 146/80 | HR 98 | Temp 97.5°F | Ht 60.0 in | Wt 155.8 lb

## 2016-08-10 DIAGNOSIS — M109 Gout, unspecified: Secondary | ICD-10-CM

## 2016-08-10 MED ORDER — TRAMADOL HCL 50 MG PO TABS: 50.0000 mg | ORAL_TABLET | Freq: Three times a day (TID) | ORAL | 0 refills | Status: DC | PRN

## 2016-08-10 MED ORDER — COLCHICINE 0.6 MG PO TABS: 0.6000 mg | ORAL_TABLET | Freq: Every day | ORAL | 0 refills | Status: DC

## 2016-08-10 NOTE — Progress Notes (Signed)
Patient ID: Ruth Perez, female    DOB: [birth year], 51 y.o.   MRN: [id number]  PCP: Jenny Reichmann, MD  Chief Complaint  Patient presents with  . Foot Swelling    X 5 right foot    Subjective:  HPI 51 year old female presents for evaluation of right foot swelling x 6 days. Foot pain improved slightly but over the last 4 days has progressively worsened. She took 800 mg ibuprofen which didn't improve pain.  Foot is warm and tender even with a bed sheet touching top of foot. Pain is most pronounced in the great toe down to the lateral side of foot. Reports foor is sore to touch. Patient admits to being a heavy beer drinking although she denies a previous episode of gout.  Social History   Social History  . Marital status: Single    Spouse name: N/A  . Number of children: 0  . Years of education: N/A   Occupational History  . Not on file.   Social History Main Topics  . Smoking status: Former Smoker    Packs/day: 1.00    Years: 35.00    Types: Cigarettes  . Smokeless tobacco: Never Used     Comment: e cig  . Alcohol use 29.4 oz/week    49 Standard drinks or equivalent per week  . Drug use: Yes    Types: Marijuana  . Sexual activity: Not on file   Other Topics Concern  . Not on file   Social History Narrative  . No narrative on file    Family History  Problem Relation Age of Onset  . Heart disease Mother   . Thyroid cancer Mother   . Kidney disease Father   . Colon polyps Father   . Heart disease Father   . Aneurysm Father     AAA  . Neuropathy Father    Review of Systems  See HPI  Patient Active Problem List   Diagnosis Date Noted  . Leukocytosis 02/17/2015  . Diverticulosis 02/17/2015  . Fever 02/17/2015  . SIRS (systemic inflammatory response syndrome) ([place]) 02/17/2015  . Alcohol abuse 02/17/2015  . Acute diverticulitis 02/17/2015  . Pancreatic lesion 02/17/2015  . Colonic diverticular abscess 02/17/2015  . Hematuria 03/01/2014  . Smoker  unmotivated to quit 04/20/2013  . Essential hypertension 10/09/2012  . Arthritis of neck ([place]) 10/09/2012  . EtOH dependence ([place]) 10/09/2012    Allergies  Allergen Reactions  . Codeine Nausea And Vomiting    Prior to Admission medications   Medication Sig Start Date End Date Taking? Authorizing Provider  lisinopril-hydrochlorothiazide (PRINZIDE,ZESTORETIC) 10-12.5 MG tablet Take 1 tablet by mouth daily. 12/19/15  Yes Darlyne Russian, MD  LORazepam (ATIVAN) 0.5 MG tablet One to 2 tablets as needed for stress every 8 hours Patient taking differently: One to 2 tablets as needed for stress every 8 hours prn 02/28/15  Yes Darlyne Russian, MD  UNABLE TO FIND Specialty bra made to fit asymmetrical breasts. 02/22/16  Yes Darlyne Russian, MD  Past Medical, Surgical Family and Social History reviewed and updated.   Objective:   Today's Vitals   08/10/16 1036  BP: (!) 146/80  Pulse: 98  Temp: 97.5 F (36.4 C)  TempSrc: Oral  SpO2: 99%  Weight: 155 lb 12.8 oz (70.7 kg)  Height: 5' (1.524 m)    Wt Readings from Last 3 Encounters:  08/10/16 155 lb 12.8 oz (70.7 kg)  05/04/16 154 lb 6 oz (70 kg)  12/21/15 150 lb (68 kg)   Physical Exam  Constitutional: She is oriented to person, place, and time. She appears well-developed and well-nourished.  HENT:  Head: Normocephalic and atraumatic.  Eyes: Pupils are equal, round, and reactive to light.  Neck: Normal range of motion.  Cardiovascular: Normal rate, regular rhythm, normal heart sounds and intact distal pulses.   Musculoskeletal: She exhibits edema and tenderness.  Right lateral foot and great toe  Neurological: She is alert and oriented to person, place, and time.  Skin: Skin is warm and dry.  Psychiatric: She has a normal mood and affect. Her behavior is normal. Judgment and thought content normal.    Dg Foot 2 Views Right  Result Date: 08/10/2016 CLINICAL DATA:  Right great toe pain for the past 5 days. EXAM: RIGHT FOOT - 2 VIEW  COMPARISON:  None. FINDINGS: No fracture or dislocation. No significant hallux valgus deformity. Joint spaces are preserved. No erosions. Regional soft tissues appear normal. Moderate-sized plantar calcaneal spur. Minimal enthesopathic change involving the Achilles tendon insertion site. Note is made of a small os trigonum. IMPRESSION: 1. No definite explanation for patient's right great toe pain. 2. Moderate size plantar calcaneal spur. Electronically Signed   By: Sandi Mariscal M.D.   On: 08/10/2016 11:25     Assessment & Plan:  1. Acute gout involving toe of right foot, unspecified cause Plan: -Colchicine-Take 1.2 mg once, wait 1 hour and take 0.6 mg.  The following day start 0.6 mg daily for prevention x 30 days. -Tramadol 50 mg every 8 hours as needed for foot pain.   Carroll Sage. [place] Kingfisher, MSN, FNP-C [facility] [place]

## 2016-08-10 NOTE — Patient Instructions (Addendum)
For your Gout Attack: Take 1.2 mg once, wait 1 hour and take 0.6 mg.  The following day start 0.6 mg daily for prevention x 30 days.  You may take Tramadol 50 mg every 8 hours as needed for foot pain.  IF you received an x-ray today, you will receive an invoice from [facility]. Please contact [facility] at [phone] with questions or concerns regarding your invoice.   IF you received labwork today, you will receive an invoice from [facility]. Please contact [facility] at [phone] with questions or concerns regarding your invoice.   Our billing staff will not be able to assist you with questions regarding bills from these companies.  You will be contacted with the lab results as soon as they are available. The fastest way to get your results is to activate your My Chart account. Instructions are located on the last page of this paperwork. If you have not heard from [place] regarding the results in 2 weeks, please contact this office.    Low-Purine Diet Purines are compounds that affect the level of uric acid in your body. A low-purine diet is a diet that is low in purines. Eating a low-purine diet can prevent the level of uric acid in your body from getting too high and causing gout or kidney stones or both. What do I need to know about this diet?  Choose low-purine foods. Examples of low-purine foods are listed in the next section.  Drink plenty of fluids, especially water. Fluids can help remove uric acid from your body. Try to drink 8-16 cups (1.9-3.8 L) a day.  Limit foods high in fat, especially saturated fat, as fat makes it harder for the body to get rid of uric acid. Foods high in saturated fat include pizza, cheese, ice cream, whole milk, fried foods, and gravies. Choose foods that are lower in fat and lean sources of protein. Use olive oil when cooking as it contains healthy fats that are not high in saturated fat.  Limit alcohol. Alcohol interferes with the  elimination of uric acid from your body. If you are having a gout attack, avoid all alcohol.  Keep in mind that different people's bodies react differently to different foods. You will probably learn over time which foods do or do not affect you. If you discover that a food tends to cause your gout to flare up, avoid eating that food. You can more freely enjoy foods that do not cause problems. If you have any questions about a food item, talk to your dietitian or health care provider. Which foods are low, moderate, and high in purines? The following is a list of foods that are low, moderate, and high in purines. You can eat any amount of the foods that are low in purines. You may be able to have small amounts of foods that are moderate in purines. Ask your health care provider how much of a food moderate in purines you can have. Avoid foods high in purines. Grains  Foods low in purines: Enriched white bread, pasta, rice, cake, cornbread, popcorn.  Foods moderate in purines: Whole-grain breads and cereals, wheat germ, bran, oatmeal. Uncooked oatmeal. Dry wheat bran or wheat germ.  Foods high in purines: Pancakes, [place] toast, biscuits, muffins. Vegetables  Foods low in purines: All vegetables, except those that are moderate in purines.  Foods moderate in purines: Asparagus, cauliflower, spinach, mushrooms, green peas. Fruits  All fruits are low in purines. Meats and other Protein Foods  Foods  low in purines: Eggs, nuts, peanut butter.  Foods moderate in purines: 80-90% lean beef, lamb, veal, pork, poultry, fish, eggs, peanut butter, nuts. Crab, lobster, oysters, and shrimp. Cooked dried beans, peas, and lentils.  Foods high in purines: Anchovies, sardines, herring, mussels, tuna, codfish, scallops, trout, and haddock. Berniece Salines. Organ meats (such as liver or kidney). Tripe. Game meat. Goose. Sweetbreads. Dairy  All dairy foods are low in purines. Low-fat and fat-free dairy products are best  because they are low in saturated fat. Beverages  Drinks low in purines: Water, carbonated beverages, tea, coffee, cocoa.  Drinks moderate in purines: Soft drinks and other drinks sweetened with high-fructose corn syrup. Juices. To find whether a food or drink is sweetened with high-fructose corn syrup, look at the ingredients list.  Drinks high in purines: Alcoholic beverages (such as beer). Condiments  Foods low in purines: Salt, herbs, olives, pickles, relishes, vinegar.  Foods moderate in purines: Butter, margarine, oils, mayonnaise. Fats and Oils  Foods low in purines: All types, except gravies and sauces made with meat.  Foods high in purines: Gravies and sauces made with meat. Other Foods  Foods low in purines: Sugars, sweets, gelatin. Cake. Soups made without meat.  Foods moderate in purines: Meat-based or fish-based soups, broths, or bouillons. Foods and drinks sweetened with high-fructose corn syrup.  Foods high in purines: High-fat desserts (such as ice cream, cookies, cakes, pies, doughnuts, and chocolate). Contact your dietitian for more information on foods that are not listed here.  This information is not intended to replace advice given to you by your health care provider. Make sure you discuss any questions you have with your health care provider. Document Released: 10/27/2010 Document Revised: 12/08/2015 Document Reviewed: 06/08/2013 Elsevier Interactive Patient Education  2017 [employer].  Gout Introduction Gout is painful swelling that can happen in some of your joints. Gout is a type of arthritis. This condition is caused by having too much uric acid in your body. Uric acid is a chemical that is made when your body breaks down substances called purines. If your body has too much uric acid, sharp crystals can form and build up in your joints. This causes pain and swelling. Gout attacks can happen quickly and be very painful (acute gout). Over time, the attacks  can affect more joints and happen more often (chronic gout). Follow these instructions at home: During a Gout Attack  If directed, put ice on the painful area:  Put ice in a plastic bag.  Place a towel between your skin and the bag.  Leave the ice on for 20 minutes, 2-3 times a day.  Rest the joint as much as possible. If the joint is in your leg, you may be given crutches to use.  Raise (elevate) the painful joint above the level of your heart as often as you can.  Drink enough fluids to keep your pee (urine) clear or pale yellow.  Take over-the-counter and prescription medicines only as told by your doctor.  Do not drive or use heavy machinery while taking prescription pain medicine.  Follow instructions from your doctor about what you can or cannot eat and drink.  Return to your normal activities as told by your doctor. Ask your doctor what activities are safe for you. Avoiding Future Gout Attacks  Follow a low-purine diet as told by a specialist (dietitian) or your doctor. Avoid foods and drinks that have a lot of purines, such as:  Liver.  Kidney.  Anchovies.  Asparagus.  Herring.  Mushrooms  Mussels.  Beer.  Limit alcohol intake to no more than 1 drink a day for nonpregnant women and 2 drinks a day for men. One drink equals 12 oz of beer, 5 oz of wine, or 1 oz of hard liquor.  Stay at a healthy weight or lose weight if you are overweight. If you want to lose weight, talk with your doctor. It is important that you do not lose weight too fast.  Start or continue an exercise plan as told by your doctor.  Drink enough fluids to keep your pee clear or pale yellow.  Take over-the-counter and prescription medicines only as told by your doctor.  Keep all follow-up visits as told by your doctor. This is important. Contact a doctor if:  You have another gout attack.  You still have symptoms of a gout attack after10 days of treatment.  You have problems (side  effects) because of your medicines.  You have chills or a fever.  You have burning pain when you pee (urinate).  You have pain in your lower back or belly. Get help right away if:  You have very bad pain.  Your pain cannot be controlled.  You cannot pee. This information is not intended to replace advice given to you by your health care provider. Make sure you discuss any questions you have with your health care provider. Document Released: 04/10/2008 Document Revised: 12/08/2015 Document Reviewed: 04/14/2015  2017 Elsevier

## 2016-08-10 NOTE — Addendum Note (Signed)
Addended by: Scot Jun on: 08/10/2016 11:52 AM   Modules accepted: Orders

## 2016-08-22 ENCOUNTER — Telehealth: Payer: Self-pay

## 2016-08-22 NOTE — Telephone Encounter (Signed)
Pt states that she is still having some pain from the gout and wants to know that even though it has been over ten days should she still be taking the meds for it   Best number [phone]

## 2016-08-22 NOTE — Telephone Encounter (Signed)
It is a lot better but big toe and toe joint below big toe is swollen and painful and its been 13 days since started med, what is next step? Stay on med? Re check?

## 2016-08-22 NOTE — Telephone Encounter (Signed)
Please advise 

## 2016-08-23 MED ORDER — PREDNISONE 20 MG PO TABS
ORAL_TABLET | ORAL | 0 refills | Status: DC
Start: 2016-08-23 — End: 2016-09-06

## 2016-08-23 NOTE — Telephone Encounter (Signed)
Please advise patient to stop the colchicine. I am ordering her a prednisone taper for 9 days. If pain persists after completion of medication, she will need to return to the office for re-evaluation.  Ruth Perez. [place] Kingfisher, MSN, FNP-C [facility] [place]

## 2016-08-23 NOTE — Telephone Encounter (Signed)
Pt advised.

## 2016-09-06 ENCOUNTER — Ambulatory Visit (INDEPENDENT_AMBULATORY_CARE_PROVIDER_SITE_OTHER): Payer: BLUE CROSS/BLUE SHIELD | Admitting: Family Medicine

## 2016-09-06 VITALS — BP 104/68 | HR 101 | Temp 98.9°F | Resp 18 | Ht 60.0 in | Wt 155.0 lb

## 2016-09-06 DIAGNOSIS — M1A9XX0 Chronic gout, unspecified, without tophus (tophi): Secondary | ICD-10-CM | POA: Diagnosis not present

## 2016-09-06 MED ORDER — KETOROLAC TROMETHAMINE 60 MG/2ML IM SOLN
60.0000 mg | Freq: Once | INTRAMUSCULAR | Status: AC
  Administered 2016-09-06: 60 mg via INTRAMUSCULAR

## 2016-09-06 MED ORDER — COLCHICINE 0.6 MG PO TABS: 0.6000 mg | ORAL_TABLET | Freq: Every day | ORAL | 1 refills | Status: DC

## 2016-09-06 MED ORDER — PREDNISONE 20 MG PO TABS: 40.0000 mg | ORAL_TABLET | Freq: Every day | ORAL | 0 refills | Status: DC

## 2016-09-06 NOTE — Patient Instructions (Addendum)
Resume 0.6 Colcihine daily and I will extend your prednisone for another 5 days in effort to completely relieve this episode of gout. After prednisone is complete, continue Colchicine regardless if symptoms in an effort to prevent future attacks.   Take 40 mg prednisone x 5 days and resume colchicine 0.6 mg daily . Return for care if symptoms worsen.   IF you received an x-ray today, you will receive an invoice from [facility]. Please contact [facility] at [phone] with questions or concerns regarding your invoice.   IF you received labwork today, you will receive an invoice from [facility]. Please contact [facility] at [phone] with questions or concerns regarding your invoice.   Our billing staff will not be able to assist you with questions regarding bills from these companies.  You will be contacted with the lab results as soon as they are available. The fastest way to get your results is to activate your My Chart account. Instructions are located on the last page of this paperwork. If you have not heard from [place] regarding the results in 2 weeks, please contact this office.      Gout Gout is painful swelling that can occur in some of your joints. Gout is a type of arthritis. This condition is caused by having too much uric acid in your body. Uric acid is a chemical that forms when your body breaks down substances called purines. Purines are important for building body proteins. When your body has too much uric acid, sharp crystals can form and build up inside your joints. This causes pain and swelling. Gout attacks can happen quickly and be very painful (acute gout). Over time, the attacks can affect more joints and become more frequent (chronic gout). Gout can also cause uric acid to build up under your skin and inside your kidneys. What are the causes? This condition is caused by too much uric acid in your blood. This can occur because:  Your kidneys do not remove  enough uric acid from your blood. This is the most common cause.  Your body makes too much uric acid. This can occur with some cancers and cancer treatments. It can also occur if your body is breaking down too many red blood cells (hemolytic anemia).  You eat too many foods that are high in purines. These foods include organ meats and some seafood. Alcohol, especially beer, is also high in purines. A gout attack may be triggered by trauma or stress. What increases the risk? This condition is more likely to develop in people who:  Have a family history of gout.  Are female and middle-aged.  Are female and have gone through menopause.  Are obese.  Frequently drink alcohol, especially beer.  Are dehydrated.  Lose weight too quickly.  Have an organ transplant.  Have lead poisoning.  Take certain medicines, including aspirin, cyclosporine, diuretics, levodopa, and niacin.  Have kidney disease or psoriasis. What are the signs or symptoms? An attack of acute gout happens quickly. It usually occurs in just one joint. The most common place is the big toe. Attacks often start at night. Other joints that may be affected include joints of the feet, ankle, knee, fingers, wrist, or elbow. Symptoms may include:  Severe pain.  Warmth.  Swelling.  Stiffness.  Tenderness. The affected joint may be very painful to touch.  Shiny, red, or purple skin.  Chills and fever. Chronic gout may cause symptoms more frequently. More joints may be involved. You may also have  white or yellow lumps (tophi) on your hands or feet or in other areas near your joints. How is this diagnosed? This condition is diagnosed based on your symptoms, medical history, and physical exam. You may have tests, such as:  Blood tests to measure uric acid levels.  Removal of joint fluid with a needle (aspiration) to look for uric acid crystals.  X-rays to look for joint damage. How is this treated? Treatment for this  condition has two phases: treating an acute attack and preventing future attacks. Acute gout treatment may include medicines to reduce pain and swelling, including:  NSAIDs.  Steroids. These are strong anti-inflammatory medicines that can be taken by mouth (orally) or injected into a joint.  Colchicine. This medicine relieves pain and swelling when it is taken soon after an attack. It can be given orally or through an IV tube. Preventive treatment may include:  Daily use of smaller doses of NSAIDs or colchicine.  Use of a medicine that reduces uric acid levels in your blood.  Changes to your diet. You may need to see a specialist about healthy eating (dietitian). Follow these instructions at home: During a Gout Attack  If directed, apply ice to the affected area:  Put ice in a plastic bag.  Place a towel between your skin and the bag.  Leave the ice on for 20 minutes, 2-3 times a day.  Rest the joint as much as possible. If the affected joint is in your leg, you may be given crutches to use.  Raise (elevate) the affected joint above the level of your heart as often as possible.  Drink enough fluids to keep your urine clear or pale yellow.  Take over-the-counter and prescription medicines only as told by your health care provider.  Do not drive or operate heavy machinery while taking prescription pain medicine.  Follow instructions from your health care provider about eating or drinking restrictions.  Return to your normal activities as told by your health care provider. Ask your health care provider what activities are safe for you. Avoiding Future Gout Attacks  Follow a low-purine diet as told by your dietitian or health care provider. Avoid foods and drinks that are high in purines, including liver, kidney, anchovies, asparagus, herring, mushrooms, mussels, and beer.  Limit alcohol intake to no more than 1 drink a day for nonpregnant women and 2 drinks a day for men. One  drink equals 12 oz of beer, 5 oz of wine, or 1 oz of hard liquor.  Maintain a healthy weight or lose weight if you are overweight. If you want to lose weight, talk with your health care provider. It is important that you do not lose weight too quickly.  Start or maintain an exercise program as told by your health care provider.  Drink enough fluids to keep your urine clear or pale yellow.  Take over-the-counter and prescription medicines only as told by your health care provider.  Keep all follow-up visits as told by your health care provider. This is important. Contact a health care provider if:  You have another gout attack.  You continue to have symptoms of a gout attack after10 days of treatment.  You have side effects from your medicines.  You have chills or a fever.  You have burning pain when you urinate.  You have pain in your lower back or belly. Get help right away if:  You have severe or uncontrolled pain.  You cannot urinate. This information is not  intended to replace advice given to you by your health care provider. Make sure you discuss any questions you have with your health care provider. Document Released: 06/29/2000 Document Revised: 12/08/2015 Document Reviewed: 04/14/2015 Elsevier Interactive Patient Education  2017 [employer].

## 2016-09-06 NOTE — Progress Notes (Signed)
Patient ID: Ruth Perez, female    DOB: Dec 31, 1965, 51 y.o.   MRN: [id number]  PCP: Jenny Reichmann, MD  Chief Complaint  Patient presents with  . Gout    right foot    Subjective:  HPI 51 year old female presents for evaluation of chronic gout flare. Pt was seen 08/10/2016 for an acute gout flare of the right great toe. She was originally treated with Colchicine which improved pain initially. She phoned our office 08/22/2016 and reported that she was still experiencing significant pain and  redness of the right great toe. She was placed on 9 day prednisone taper which she reports improved pain in right toe although 2 days ago, her right great toe had increased pain, swelling, and redness. She stopped taking colchicine after her initial flare improved. Denies any recent intake of organ meet, seafood, or beer since initially flare 08/10/2016.   Social History   Social History  . Marital status: Single    Spouse name: N/A  . Number of children: 0  . Years of education: N/A   Occupational History  . Not on file.   Social History Main Topics  . Smoking status: Former Smoker    Packs/day: 1.00    Years: 35.00    Types: Cigarettes  . Smokeless tobacco: Never Used     Comment: e cig  . Alcohol use 29.4 oz/week    49 Standard drinks or equivalent per week  . Drug use: Yes    Types: Marijuana  . Sexual activity: Not on file   Other Topics Concern  . Not on file   Social History Narrative  . No narrative on file    Family History  Problem Relation Age of Onset  . Heart disease Mother   . Thyroid cancer Mother   . Kidney disease Father   . Colon polyps Father   . Heart disease Father   . Aneurysm Father     AAA  . Neuropathy Father    Review of Systems See HPI  Patient Active Problem List   Diagnosis Date Noted  . Leukocytosis 02/17/2015  . Diverticulosis 02/17/2015  . Fever 02/17/2015  . SIRS (systemic inflammatory response syndrome) ([place]) 02/17/2015  .  Alcohol abuse 02/17/2015  . Acute diverticulitis 02/17/2015  . Pancreatic lesion 02/17/2015  . Colonic diverticular abscess 02/17/2015  . Hematuria 03/01/2014  . Smoker unmotivated to quit 04/20/2013  . Essential hypertension 10/09/2012  . Arthritis of neck ([place]) 10/09/2012  . EtOH dependence ([place]) 10/09/2012    Allergies  Allergen Reactions  . Codeine Nausea And Vomiting    Prior to Admission medications   Medication Sig Start Date End Date Taking? Authorizing Provider  lisinopril-hydrochlorothiazide (PRINZIDE,ZESTORETIC) 10-12.5 MG tablet Take 1 tablet by mouth daily. 12/19/15  Yes Darlyne Russian, MD  LORazepam (ATIVAN) 0.5 MG tablet One to 2 tablets as needed for stress every 8 hours Patient taking differently: One to 2 tablets as needed for stress every 8 hours prn 02/28/15  Yes Darlyne Russian, MD  traMADol (ULTRAM) 50 MG tablet Take 1 tablet (50 mg total) by mouth every 8 (eight) hours as needed. 08/10/16  Yes Sedalia Muta, FNP  UNABLE TO FIND Specialty bra made to fit asymmetrical breasts. 02/22/16  Yes Darlyne Russian, MD    Past Medical, Surgical Family and Social History reviewed and updated.    Objective:   Today's Vitals   09/06/16 1533  BP: 104/68  Pulse: (!) 101  Resp: 18  Temp: 98.9 F (37.2 C)  TempSrc: Oral  SpO2: 98%  Weight: 155 lb (70.3 kg)  Height: 5' (1.524 m)    Wt Readings from Last 3 Encounters:  09/06/16 155 lb (70.3 kg)  08/10/16 155 lb 12.8 oz (70.7 kg)  05/04/16 154 lb 6 oz (70 kg)    Physical Exam  Constitutional: She is oriented to person, place, and time. She appears well-developed and well-nourished.  HENT:  Head: Normocephalic and atraumatic.  Neck: Normal range of motion. Neck supple.  Cardiovascular: Normal rate, regular rhythm, normal heart sounds and intact distal pulses.   Pulmonary/Chest: Effort normal and breath sounds normal.  Musculoskeletal: She exhibits edema and tenderness.  Neurological: She is alert and  oriented to person, place, and time.  Skin: Skin is warm and dry. There is erythema.  Psychiatric: She has a normal mood and affect. Her behavior is normal. Judgment and thought content normal.      Assessment & Plan:  1. Chronic gout involving toe of left foot without tophus, unspecified cause - ketorolac (TORADOL) injection 60 mg; Inject 2 mLs (60 mg total) into the muscle once.  Plan: Take 40 mg prednisone x 5 days and resume colchicine 0.6 mg daily . Return for care if symptoms worsen or do not improve.  Carroll Sage. Kenton Kingfisher, MSN, FNP-C [facility] [place]

## 2016-12-24 ENCOUNTER — Other Ambulatory Visit: Payer: Self-pay | Admitting: Emergency Medicine

## 2016-12-24 DIAGNOSIS — I10 Essential (primary) hypertension: Secondary | ICD-10-CM

## 2016-12-25 NOTE — Telephone Encounter (Signed)
Please call patient. Rx authorized. Needs to schedule follow-up and establish with new PCP since Dr. Everlene Farrier has retired.  Meds ordered this encounter  Medications  . lisinopril-hydrochlorothiazide (PRINZIDE,ZESTORETIC) 10-12.5 MG tablet    Sig: TAKE ONE TABLET BY MOUTH ONCE DAILY    Dispense:  90 tablet    Refill:  0    Please notify patient that s/he needs an office visit +/- labsfor additional refills.

## 2016-12-26 NOTE — Telephone Encounter (Signed)
SPOKE WITH PT SHE WILL GET WITH HER BOSS TO FIND OUT WHATS A GOOD DATE AND MONTH FOR HER TO COME INTO OFFICE FOR AN ESTABLISH CARE VISIT  WITH CHELLE JEFFERY

## 2017-03-12 ENCOUNTER — Encounter: Payer: Self-pay | Admitting: Physician Assistant

## 2017-03-12 ENCOUNTER — Ambulatory Visit (INDEPENDENT_AMBULATORY_CARE_PROVIDER_SITE_OTHER): Payer: BLUE CROSS/BLUE SHIELD | Admitting: Physician Assistant

## 2017-03-12 VITALS — BP 113/78 | HR 93 | Temp 98.6°F | Resp 18 | Ht 60.0 in | Wt 156.2 lb

## 2017-03-12 DIAGNOSIS — M1A9XX0 Chronic gout, unspecified, without tophus (tophi): Secondary | ICD-10-CM

## 2017-03-12 DIAGNOSIS — I10 Essential (primary) hypertension: Secondary | ICD-10-CM | POA: Diagnosis not present

## 2017-03-12 MED ORDER — LISINOPRIL-HYDROCHLOROTHIAZIDE 10-12.5 MG PO TABS: 1.0000 | ORAL_TABLET | Freq: Every day | ORAL | 1 refills | Status: DC

## 2017-03-12 MED ORDER — COLCHICINE 0.6 MG PO TABS: 0.6000 mg | ORAL_TABLET | Freq: Every day | ORAL | 1 refills | Status: DC

## 2017-03-12 NOTE — Patient Instructions (Addendum)
DASH Eating Plan DASH stands for "Dietary Approaches to Stop Hypertension." The DASH eating plan is a healthy eating plan that has been shown to reduce high blood pressure (hypertension). It may also reduce your risk for type 2 diabetes, heart disease, and stroke. The DASH eating plan may also help with weight loss. What are tips for following this plan? General guidelines  Avoid eating more than 2,300 mg (milligrams) of salt (sodium) a day. If you have hypertension, you may need to reduce your sodium intake to 1,500 mg a day.  Limit alcohol intake to no more than 1 drink a day for nonpregnant women and 2 drinks a day for men. One drink equals 12 oz of beer, 5 oz of wine, or 1 oz of hard liquor.  Work with your health care provider to maintain a healthy body weight or to lose weight. Ask what an ideal weight is for you.  Get at least 30 minutes of exercise that causes your heart to beat faster (aerobic exercise) most days of the week. Activities may include walking, swimming, or biking.  Work with your health care provider or diet and nutrition specialist (dietitian) to adjust your eating plan to your individual calorie needs. Reading food labels  Check food labels for the amount of sodium per serving. Choose foods with less than 5 percent of the Daily Value of sodium. Generally, foods with less than 300 mg of sodium per serving fit into this eating plan.  To find whole grains, look for the word "whole" as the first word in the ingredient list. Shopping  Buy products labeled as "low-sodium" or "no salt added."  Buy fresh foods. Avoid canned foods and premade or frozen meals. Cooking  Avoid adding salt when cooking. Use salt-free seasonings or herbs instead of table salt or sea salt. Check with your health care provider or pharmacist before using salt substitutes.  Do not fry foods. Cook foods using healthy methods such as baking, boiling, grilling, and broiling instead.  Cook with  heart-healthy oils, such as olive, canola, soybean, or sunflower oil. Meal planning   Eat a balanced diet that includes: ? 5 or more servings of fruits and vegetables each day. At each meal, try to fill half of your plate with fruits and vegetables. ? Up to 6-8 servings of whole grains each day. ? Less than 6 oz of lean meat, poultry, or fish each day. A 3-oz serving of meat is about the same size as a deck of cards. One egg equals 1 oz. ? 2 servings of low-fat dairy each day. ? A serving of nuts, seeds, or beans 5 times each week. ? Heart-healthy fats. Healthy fats called Omega-3 fatty acids are found in foods such as flaxseeds and coldwater fish, like sardines, salmon, and mackerel.  Limit how much you eat of the following: ? Canned or prepackaged foods. ? Food that is high in trans fat, such as fried foods. ? Food that is high in saturated fat, such as fatty meat. ? Sweets, desserts, sugary drinks, and other foods with added sugar. ? Full-fat dairy products.  Do not salt foods before eating.  Try to eat at least 2 vegetarian meals each week.  Eat more home-cooked food and less restaurant, buffet, and fast food.  When eating at a restaurant, ask that your food be prepared with less salt or no salt, if possible. What foods are recommended? The items listed may not be a complete list. Talk with your dietitian about  what dietary choices are best for you. Grains Whole-grain or whole-wheat bread. Whole-grain or whole-wheat pasta. Brown rice. Oatmeal. Quinoa. Bulgur. Whole-grain and low-sodium cereals. Pita bread. Low-fat, low-sodium crackers. Whole-wheat flour tortillas. Vegetables Fresh or frozen vegetables (raw, steamed, roasted, or grilled). Low-sodium or reduced-sodium tomato and vegetable juice. Low-sodium or reduced-sodium tomato sauce and tomato paste. Low-sodium or reduced-sodium canned vegetables. Fruits All fresh, dried, or frozen fruit. Canned fruit in natural juice (without  added sugar). Meat and other protein foods Skinless chicken or turkey. Ground chicken or turkey. Pork with fat trimmed off. Fish and seafood. Egg whites. Dried beans, peas, or lentils. Unsalted nuts, nut butters, and seeds. Unsalted canned beans. Lean cuts of beef with fat trimmed off. Low-sodium, lean deli meat. Dairy Low-fat (1%) or fat-free (skim) milk. Fat-free, low-fat, or reduced-fat cheeses. Nonfat, low-sodium ricotta or cottage cheese. Low-fat or nonfat yogurt. Low-fat, low-sodium cheese. Fats and oils Soft margarine without trans fats. Vegetable oil. Low-fat, reduced-fat, or light mayonnaise and salad dressings (reduced-sodium). Canola, safflower, olive, soybean, and sunflower oils. Avocado. Seasoning and other foods Herbs. Spices. Seasoning mixes without salt. Unsalted popcorn and pretzels. Fat-free sweets. What foods are not recommended? The items listed may not be a complete list. Talk with your dietitian about what dietary choices are best for you. Grains Baked goods made with fat, such as croissants, muffins, or some breads. Dry pasta or rice meal packs. Vegetables Creamed or fried vegetables. Vegetables in a cheese sauce. Regular canned vegetables (not low-sodium or reduced-sodium). Regular canned tomato sauce and paste (not low-sodium or reduced-sodium). Regular tomato and vegetable juice (not low-sodium or reduced-sodium). Pickles. Olives. Fruits Canned fruit in a light or heavy syrup. Fried fruit. Fruit in cream or butter sauce. Meat and other protein foods Fatty cuts of meat. Ribs. Fried meat. Bacon. Sausage. Bologna and other processed lunch meats. Salami. Fatback. Hotdogs. Bratwurst. Salted nuts and seeds. Canned beans with added salt. Canned or smoked fish. Whole eggs or egg yolks. Chicken or turkey with skin. Dairy Whole or 2% milk, cream, and half-and-half. Whole or full-fat cream cheese. Whole-fat or sweetened yogurt. Full-fat cheese. Nondairy creamers. Whipped toppings.  Processed cheese and cheese spreads. Fats and oils Butter. Stick margarine. Lard. Shortening. Ghee. Bacon fat. Tropical oils, such as coconut, palm kernel, or palm oil. Seasoning and other foods Salted popcorn and pretzels. Onion salt, garlic salt, seasoned salt, table salt, and sea salt. Worcestershire sauce. Tartar sauce. Barbecue sauce. Teriyaki sauce. Soy sauce, including reduced-sodium. Steak sauce. Canned and packaged gravies. Fish sauce. Oyster sauce. Cocktail sauce. Horseradish that you find on the shelf. Ketchup. Mustard. Meat flavorings and tenderizers. Bouillon cubes. Hot sauce and Tabasco sauce. Premade or packaged marinades. Premade or packaged taco seasonings. Relishes. Regular salad dressings. Where to find more information:  National Heart, Lung, and Blood Institute: www.nhlbi.nih.gov  American Heart Association: www.heart.org Summary  The DASH eating plan is a healthy eating plan that has been shown to reduce high blood pressure (hypertension). It may also reduce your risk for type 2 diabetes, heart disease, and stroke.  With the DASH eating plan, you should limit salt (sodium) intake to 2,300 mg a day. If you have hypertension, you may need to reduce your sodium intake to 1,500 mg a day.  When on the DASH eating plan, aim to eat more fresh fruits and vegetables, whole grains, lean proteins, low-fat dairy, and heart-healthy fats.  Work with your health care provider or diet and nutrition specialist (dietitian) to adjust your eating plan to your   individual calorie needs. This information is not intended to replace advice given to you by your health care provider. Make sure you discuss any questions you have with your health care provider. Document Released: 06/21/2011 Document Revised: 06/25/2016 Document Reviewed: 06/25/2016 Elsevier Interactive Patient Education  2017 [employer].    IF you received an x-ray today, you will receive an invoice from [facility].  Please contact [facility] at [phone] with questions or concerns regarding your invoice.   IF you received labwork today, you will receive an invoice from [place]. Please contact [facility] at [phone] with questions or concerns regarding your invoice.   Our billing staff will not be able to assist you with questions regarding bills from these companies.  You will be contacted with the lab results as soon as they are available. The fastest way to get your results is to activate your My Chart account. Instructions are located on the last page of this paperwork. If you have not heard from [place] regarding the results in 2 weeks, please contact this office.

## 2017-03-12 NOTE — Progress Notes (Signed)
PRIMARY CARE AT [place], [place] [address] [phone]  Date:  03/12/2017   Name:  Ruth Perez   DOB:  Nov 05, 1965   MRN:  [id number]  PCP:  No primary care provider on file.    History of Present Illness:  Ruth Perez is a 51 y.o. female patient who presents to PCP with  Chief Complaint  Patient presents with  . Medication Refill    lisinopril  . Medication Problem    would like to discuss colchicine      HTN: patient is not checking blood pressure at home.  She takes the lisinopril compliaintly.  She notes that she continues to eat what she wants, but will try to cut back on sodium in lunch meats by eating it twice weekly.  etOH use is daily 6 beers per day.    GOUT: no other attacks since 6 months ago.  This was foot swelling that was at the great toe of the right foot.  She recallls not even able to have a sheet on it.  Swelling was up the foot.  Colchicine appeared to help.  No fluid for testing was done at the time, but she recalls no trauma oro heavy ambulation.    Wt Readings from Last 3 Encounters:  03/12/17 156 lb 3.2 oz (70.9 kg)  09/06/16 155 lb (70.3 kg)  08/10/16 155 lb 12.8 oz (70.7 kg)     Patient Active Problem List   Diagnosis Date Noted  . Leukocytosis 02/17/2015  . Diverticulosis 02/17/2015  . Fever 02/17/2015  . SIRS (systemic inflammatory response syndrome) ([place]) 02/17/2015  . Alcohol abuse 02/17/2015  . Acute diverticulitis 02/17/2015  . Pancreatic lesion 02/17/2015  . Colonic diverticular abscess 02/17/2015  . Hematuria 03/01/2014  . Smoker unmotivated to quit 04/20/2013  . Essential hypertension 10/09/2012  . Arthritis of neck ([place]) 10/09/2012  . EtOH dependence ([place]) 10/09/2012    Past Medical History:  Diagnosis Date  . Alcohol abuse   . Arthritis   . Diverticulitis   . Hyperlipidemia   . Hypertension   . Pancreatic cyst     Past Surgical History:  Procedure Laterality Date  . MOUTH SURGERY    .  TONSILLECTOMY      Social History  Substance Use Topics  . Smoking status: Former Smoker    Packs/day: 1.00    Years: 35.00    Types: Cigarettes  . Smokeless tobacco: Never Used     Comment: e cig  . Alcohol use 29.4 oz/week    49 Standard drinks or equivalent per week    Family History  Problem Relation Age of Onset  . Heart disease Mother   . Thyroid cancer Mother   . Kidney disease Father   . Colon polyps Father   . Heart disease Father   . Aneurysm Father        AAA  . Neuropathy Father     Allergies  Allergen Reactions  . Codeine Nausea And Vomiting    Medication list has been reviewed and updated.  Current Outpatient Prescriptions on File Prior to Visit  Medication Sig Dispense Refill  . colchicine 0.6 MG tablet Take 1 tablet (0.6 mg total) by mouth daily. 90 tablet 1  . lisinopril-hydrochlorothiazide (PRINZIDE,ZESTORETIC) 10-12.5 MG tablet TAKE ONE TABLET BY MOUTH ONCE DAILY 90 tablet 0  . LORazepam (ATIVAN) 0.5 MG tablet One to 2 tablets as needed for stress every 8 hours (Patient taking differently: One to 2 tablets  as needed for stress every 8 hours prn) 30 tablet 0  . UNABLE TO FIND Specialty bra made to fit asymmetrical breasts. 1 each 0  . predniSONE (DELTASONE) 20 MG tablet Take 2 tablets (40 mg total) by mouth daily with breakfast. (Patient not taking: Reported on 03/12/2017) 10 tablet 0  . traMADol (ULTRAM) 50 MG tablet Take 1 tablet (50 mg total) by mouth every 8 (eight) hours as needed. (Patient not taking: Reported on 03/12/2017) 30 tablet 0   No current facility-administered medications on file prior to visit.     ROS ROS otherwise unremarkable unless listed above.  Physical Examination: BP 113/78   Pulse 93   Temp 98.6 F (37 C) (Oral)   Resp 18   Ht 5' (1.524 m)   Wt 156 lb 3.2 oz (70.9 kg)   LMP 02/18/2013   SpO2 98%   BMI 30.51 kg/m  Ideal Body Weight: Weight in (lb) to have BMI = 25: 127.7  Physical Exam  Constitutional: She is  oriented to person, place, and time. She appears well-developed and well-nourished. No distress.  HENT:  Head: Normocephalic and atraumatic.  Right Ear: External ear normal.  Left Ear: External ear normal.  Eyes: Pupils are equal, round, and reactive to light. Conjunctivae and EOM are normal.  Cardiovascular: Normal rate, regular rhythm and normal heart sounds.  Exam reveals no gallop and no friction rub.   No murmur heard. Pulses:      Radial pulses are 2+ on the right side, and 2+ on the left side.  Pulmonary/Chest: Effort normal. No respiratory distress.  Musculoskeletal:  Great toe without tenderness at the mtp.  Normal rom.    Neurological: She is alert and oriented to person, place, and time.  Skin: She is not diaphoretic.  Psychiatric: She has a normal mood and affect. Her behavior is normal.     Assessment and Plan: Ruth Perez is a 51 y.o. female who is here today for cc of medication refill Fine to refill at this time.  Will recheck kidney, liver function, and electroytes.  Will also add uric acid level.  Warned of the flares exacerbated by over indulgence, etOH, shellfish.  Plans to go to the beach, and states, that she will imbibe.  6 month, return. At this current time, she is stable. Chronic gout involving toe of right foot without tophus, unspecified cause - Plan: colchicine 0.6 MG tablet, Uric Acid  Essential hypertension - Plan: lisinopril-hydrochlorothiazide (PRINZIDE,ZESTORETIC) 10-12.5 MG tablet, CMP14+EGFR  Ivar Drape, PA-C [facility] 8/30/20189:33 AM

## 2017-03-13 LAB — CMP14+EGFR
ALK PHOS: 62 IU/L (ref 39–117)
ALT: 30 IU/L (ref 0–32)
AST: 25 IU/L (ref 0–40)
Albumin/Globulin Ratio: 1.8 (ref 1.2–2.2)
Albumin: 5 g/dL (ref 3.5–5.5)
BILIRUBIN TOTAL: 0.2 mg/dL (ref 0.0–1.2)
BUN/Creatinine Ratio: 20 (ref 9–23)
BUN: 15 mg/dL (ref 6–24)
CALCIUM: 10.1 mg/dL (ref 8.7–10.2)
CO2: 23 mmol/L (ref 20–29)
Chloride: 96 mmol/L (ref 96–106)
Creatinine, Ser: 0.76 mg/dL (ref 0.57–1.00)
GFR calc Af Amer: 104 mL/min/{1.73_m2} (ref >59)
GFR, EST NON AFRICAN AMERICAN: 90 mL/min/{1.73_m2} (ref >59)
GLOBULIN, TOTAL: 2.8 g/dL (ref 1.5–4.5)
Glucose: 97 mg/dL (ref 65–99)
POTASSIUM: 3.7 mmol/L (ref 3.5–5.2)
SODIUM: 140 mmol/L (ref 134–144)
Total Protein: 7.8 g/dL (ref 6.0–8.5)

## 2017-03-13 LAB — URIC ACID: Uric Acid: 11.9 mg/dL — ABNORMAL HIGH (ref 2.5–7.1)

## 2017-03-27 ENCOUNTER — Telehealth: Payer: Self-pay | Admitting: Family Medicine

## 2017-03-27 NOTE — Telephone Encounter (Signed)
Patient calling stating that the Colchicine need prior authorization

## 2017-03-28 NOTE — Telephone Encounter (Signed)
Please advise 

## 2017-03-28 NOTE — Telephone Encounter (Signed)
Called patient's insurance company and verified that cholcicine needed [place].  They denied this and stated that it requires brand only and not generic and tablet form not capsule to be approved.  Called pharmacy back and medication was approved.  Patient notified.

## 2017-04-24 ENCOUNTER — Encounter: Payer: Self-pay | Admitting: Physician Assistant

## 2017-04-24 ENCOUNTER — Ambulatory Visit (INDEPENDENT_AMBULATORY_CARE_PROVIDER_SITE_OTHER): Payer: BLUE CROSS/BLUE SHIELD | Admitting: Physician Assistant

## 2017-04-24 VITALS — BP 126/80 | HR 87 | Temp 98.7°F | Resp 18 | Ht 60.0 in | Wt 153.2 lb

## 2017-04-24 DIAGNOSIS — R102 Pelvic and perineal pain: Secondary | ICD-10-CM | POA: Diagnosis not present

## 2017-04-24 DIAGNOSIS — N6489 Other specified disorders of breast: Secondary | ICD-10-CM | POA: Diagnosis not present

## 2017-04-24 DIAGNOSIS — M545 Low back pain: Secondary | ICD-10-CM | POA: Diagnosis not present

## 2017-04-24 LAB — POCT URINALYSIS DIP (MANUAL ENTRY)
BILIRUBIN UA: NEGATIVE
Blood, UA: NEGATIVE
Glucose, UA: NEGATIVE mg/dL
Ketones, POC UA: NEGATIVE mg/dL
LEUKOCYTES UA: NEGATIVE
NITRITE UA: NEGATIVE
PH UA: 7 (ref 5.0–8.0)
PROTEIN UA: NEGATIVE mg/dL
Spec Grav, UA: <=1.005 — AB (ref 1.010–1.025)
Urobilinogen, UA: 0.2 E.U./dL

## 2017-04-24 LAB — POC MICROSCOPIC URINALYSIS (UMFC): Mucus: ABSENT

## 2017-04-24 MED ORDER — MELOXICAM 7.5 MG PO TABS: 7.5000 mg | ORAL_TABLET | Freq: Every day | ORAL | 0 refills | Status: DC

## 2017-04-24 MED ORDER — CYCLOBENZAPRINE HCL 10 MG PO TABS: 5.0000 mg | ORAL_TABLET | Freq: Three times a day (TID) | ORAL | 0 refills | Status: DC | PRN

## 2017-04-24 NOTE — Patient Instructions (Addendum)
Make sure you are drinking a lot of water Ice the back three times per day for 15 minutes.  Do not operate heavy machinery with taking the flexeril. Please do not take ibuprofen or naproxen with the mobic.     Sciatica Rehab Ask your health care provider which exercises are safe for you. Do exercises exactly as told by your health care provider and adjust them as directed. It is normal to feel mild stretching, pulling, tightness, or discomfort as you do these exercises, but you should stop right away if you feel sudden pain or your pain gets worse.Do not begin these exercises until told by your health care provider. Stretching and range of motion exercises These exercises warm up your muscles and joints and improve the movement and flexibility of your hips and your back. These exercises also help to relieve pain, numbness, and tingling. Exercise A: Sciatic nerve glide 1. Sit in a chair with your head facing down toward your chest. Place your hands behind your back. Let your shoulders slump forward. 2. Slowly straighten one of your knees while you tilt your head back as if you are looking toward the ceiling. Only straighten your leg as far as you can without making your symptoms worse. 3. Hold for __________ seconds. 4. Slowly return to the starting position. 5. Repeat with your other leg. Repeat __________ times. Complete this exercise __________ times a day. Exercise B: Knee to chest with hip adduction and internal rotation  1. Lie on your back on a firm surface with both legs straight. 2. Bend one of your knees and move it up toward your chest until you feel a gentle stretch in your lower back and buttock. Then, move your knee toward the shoulder that is on the opposite side from your leg. ? Hold your leg in this position by holding onto the front of your knee. 3. Hold for __________ seconds. 4. Slowly return to the starting position. 5. Repeat with your other leg. Repeat __________  times. Complete this exercise __________ times a day. Exercise C: Prone extension on elbows  1. Lie on your abdomen on a firm surface. A bed may be too soft for this exercise. 2. Prop yourself up on your elbows. 3. Use your arms to help lift your chest up until you feel a gentle stretch in your abdomen and your lower back. ? This will place some of your body weight on your elbows. If this is uncomfortable, try stacking pillows under your chest. ? Your hips should stay down, against the surface that you are lying on. Keep your hip and back muscles relaxed. 4. Hold for __________ seconds. 5. Slowly relax your upper body and return to the starting position. Repeat __________ times. Complete this exercise __________ times a day. Strengthening exercises These exercises build strength and endurance in your back. Endurance is the ability to use your muscles for a long time, even after they get tired. Exercise D: Pelvic tilt 1. Lie on your back on a firm surface. Bend your knees and keep your feet flat. 2. Tense your abdominal muscles. Tip your pelvis up toward the ceiling and flatten your lower back into the floor. ? To help with this exercise, you may place a small towel under your lower back and try to push your back into the towel. 3. Hold for __________ seconds. 4. Let your muscles relax completely before you repeat this exercise. Repeat __________ times. Complete this exercise __________ times a day. Exercise E: Alternating  arm and leg raises  1. Get on your hands and knees on a firm surface. If you are on a hard floor, you may want to use padding to cushion your knees, such as an exercise mat. 2. Line up your arms and legs. Your hands should be below your shoulders, and your knees should be below your hips. 3. Lift your left leg behind you. At the same time, raise your right arm and straighten it in front of you. ? Do not lift your leg higher than your hip. ? Do not lift your arm higher than  your shoulder. ? Keep your abdominal and back muscles tight. ? Keep your hips facing the ground. ? Do not arch your back. ? Keep your balance carefully, and do not hold your breath. 4. Hold for __________ seconds. 5. Slowly return to the starting position and repeat with your right leg and your left arm. Repeat __________ times. Complete this exercise __________ times a day. Posture and body mechanics  Body mechanics refers to the movements and positions of your body while you do your daily activities. Posture is part of body mechanics. Good posture and healthy body mechanics can help to relieve stress in your body's tissues and joints. Good posture means that your spine is in its natural S-curve position (your spine is neutral), your shoulders are pulled back slightly, and your head is not tipped forward. The following are general guidelines for applying improved posture and body mechanics to your everyday activities. Standing   When standing, keep your spine neutral and your feet about hip-width apart. Keep a slight bend in your knees. Your ears, shoulders, and hips should line up.  When you do a task in which you stand in one place for a long time, place one foot up on a stable object that is 2-4 inches (5-10 cm) high, such as a footstool. This helps keep your spine neutral. Sitting   When sitting, keep your spine neutral and keep your feet flat on the floor. Use a footrest, if necessary, and keep your thighs parallel to the floor. Avoid rounding your shoulders, and avoid tilting your head forward.  When working at a desk or a computer, keep your desk at a height where your hands are slightly lower than your elbows. Slide your chair under your desk so you are close enough to maintain good posture.  When working at a computer, place your monitor at a height where you are looking straight ahead and you do not have to tilt your head forward or downward to look at the  screen. Resting   When lying down and resting, avoid positions that are most painful for you.  If you have pain with activities such as sitting, bending, stooping, or squatting (flexion-based activities), lie in a position in which your body does not bend very much. For example, avoid curling up on your side with your arms and knees near your chest (fetal position).  If you have pain with activities such as standing for a long time or reaching with your arms (extension-based activities), lie with your spine in a neutral position and bend your knees slightly. Try the following positions: ? Lying on your side with a pillow between your knees. ? Lying on your back with a pillow under your knees. Lifting   When lifting objects, keep your feet at least shoulder-width apart and tighten your abdominal muscles.  Bend your knees and hips and keep your spine neutral. It is important to  lift using the strength of your legs, not your back. Do not lock your knees straight out.  Always ask for help to lift heavy or awkward objects. This information is not intended to replace advice given to you by your health care provider. Make sure you discuss any questions you have with your health care provider. Document Released: 07/02/2005 Document Revised: 03/08/2016 Document Reviewed: 03/18/2015 Elsevier Interactive Patient Education  2018 [place].  Back Pain, Adult Back pain is very common. The pain often gets better over time. The cause of back pain is usually not dangerous. Most people can learn to manage their back pain on their own. Follow these instructions at home: Watch your back pain for any changes. The following actions may help to lessen any pain you are feeling:  Stay active. Start with short walks on flat ground if you can. Try to walk farther each day.  Exercise regularly as told by your doctor. Exercise helps your back heal faster. It also helps avoid future injury by keeping your  muscles strong and flexible.  Do not sit, drive, or stand in one place for more than 30 minutes.  Do not stay in bed. Resting more than 1-2 days can slow down your recovery.  Be careful when you bend or lift an object. Use good form when lifting: ? Bend at your knees. ? Keep the object close to your body. ? Do not twist.  Sleep on a firm mattress. Lie on your side, and bend your knees. If you lie on your back, put a pillow under your knees.  Take medicines only as told by your doctor.  Put ice on the injured area. ? Put ice in a plastic bag. ? Place a towel between your skin and the bag. ? Leave the ice on for 20 minutes, 2-3 times a day for the first 2-3 days. After that, you can switch between ice and heat packs.  Avoid feeling anxious or stressed. Find good ways to deal with stress, such as exercise.  Maintain a healthy weight. Extra weight puts stress on your back.  Contact a doctor if:  You have pain that does not go away with rest or medicine.  You have worsening pain that goes down into your legs or buttocks.  You have pain that does not get better in one week.  You have pain at night.  You lose weight.  You have a fever or chills. Get help right away if:  You cannot control when you poop (bowel movement) or pee (urinate).  Your arms or legs feel weak.  Your arms or legs lose feeling (numbness).  You feel sick to your stomach (nauseous) or throw up (vomit).  You have belly (abdominal) pain.  You feel like you may pass out (faint). This information is not intended to replace advice given to you by your health care provider. Make sure you discuss any questions you have with your health care provider. Document Released: 12/19/2007 Document Revised: 12/08/2015 Document Reviewed: 11/03/2013 Elsevier Interactive Patient Education  2018 [employer].     IF you received an x-ray today, you will receive an invoice from [facility]. Please contact  [facility] at [phone] with questions or concerns regarding your invoice.   IF you received labwork today, you will receive an invoice from [facility]. Please contact [facility] at [phone] with questions or concerns regarding your invoice.   Our billing staff will not be able to assist you with questions regarding bills from these  companies.  You will be contacted with the lab results as soon as they are available. The fastest way to get your results is to activate your My Chart account. Instructions are located on the last page of this paperwork. If you have not heard from [place] regarding the results in 2 weeks, please contact this office.

## 2017-04-24 NOTE — Progress Notes (Signed)
PRIMARY CARE AT [place], [place] [address] [phone]  Date:  04/24/2017   Name:  NOEL HENANDEZ   DOB:  [birth year]   MRN:  [id number]  PCP:  Joretta Bachelor, PA    History of Present Illness:  MATY ZEISLER is a 51 y.o. female patient who presents to PCP with  Chief Complaint  Patient presents with  . Back Pain    lower back pain; not sure what is causing it  . Other    abdominal discomfort     5 days ago, she had abdominal discomfort at her right side.  She describes as if something felt like it was dropping.  Her lower back and right side.   No change in her bowel movements.   No hematuria, dysuria, or frequency.  Also complains of needing more supportive fitting bra.  She has obtained a bra with modified cups/support for her asymmetrical breast.  This has been a chronic issue.  She has lived with this since her post-puberty life.  It has caused her great inferiority complexes, and self-esteem changes.  She was not able to change only in private, and has effected her qol with engaging in activity of exercise due to the deformity, and social activity in engagement with potential partner relationships.  This has been a great boost due to the discrete appearance of the added cup.  22 and 27  Patient Active Problem List   Diagnosis Date Noted  . Leukocytosis 02/17/2015  . Diverticulosis 02/17/2015  . Fever 02/17/2015  . SIRS (systemic inflammatory response syndrome) ([place]) 02/17/2015  . Alcohol abuse 02/17/2015  . Acute diverticulitis 02/17/2015  . Pancreatic lesion 02/17/2015  . Colonic diverticular abscess 02/17/2015  . Hematuria 03/01/2014  . Smoker unmotivated to quit 04/20/2013  . Essential hypertension 10/09/2012  . Arthritis of neck 10/09/2012  . EtOH dependence ([place]) 10/09/2012    Past Medical History:  Diagnosis Date  . Alcohol abuse   . Arthritis   . Diverticulitis   . Hyperlipidemia   . Hypertension   . Pancreatic cyst      Past Surgical History:  Procedure Laterality Date  . MOUTH SURGERY    . TONSILLECTOMY      Social History  Substance Use Topics  . Smoking status: Former Smoker    Packs/day: 1.00    Years: 35.00    Types: Cigarettes  . Smokeless tobacco: Never Used     Comment: e cig  . Alcohol use 29.4 oz/week    49 Standard drinks or equivalent per week    Family History  Problem Relation Age of Onset  . Heart disease Mother   . Thyroid cancer Mother   . Kidney disease Father   . Colon polyps Father   . Heart disease Father   . Aneurysm Father        AAA  . Neuropathy Father     Allergies  Allergen Reactions  . Codeine Nausea And Vomiting    Medication list has been reviewed and updated.  Current Outpatient Prescriptions on File Prior to Visit  Medication Sig Dispense Refill  . colchicine 0.6 MG tablet Take 1 tablet (0.6 mg total) by mouth daily. 90 tablet 1  . lisinopril-hydrochlorothiazide (PRINZIDE,ZESTORETIC) 10-12.5 MG tablet Take 1 tablet by mouth daily. 90 tablet 1  . LORazepam (ATIVAN) 0.5 MG tablet One to 2 tablets as needed for stress every 8 hours (Patient taking differently: One to 2 tablets as needed for stress every  8 hours prn) 30 tablet 0  . UNABLE TO FIND Specialty bra made to fit asymmetrical breasts. 1 each 0   No current facility-administered medications on file prior to visit.     ROS ROS otherwise unremarkable unless listed above.  Physical Examination: BP 126/80   Pulse 87   Temp 98.7 F (37.1 C) (Oral)   Resp 18   Ht 5' (1.524 m)   Wt 153 lb 3.2 oz (69.5 kg)   LMP 02/18/2013   SpO2 98%   BMI 29.92 kg/m  Ideal Body Weight: Weight in (lb) to have BMI = 25: 127.7  Physical Exam  Constitutional: She is oriented to person, place, and time. She appears well-developed and well-nourished. No distress.  HENT:  Head: Normocephalic and atraumatic.  Right Ear: External ear normal.  Left Ear: External ear normal.  Eyes: Pupils are equal, round,  and reactive to light. Conjunctivae and EOM are normal.  Cardiovascular: Normal rate.   Pulmonary/Chest: Effort normal. No apnea. No respiratory distress. She has no decreased breath sounds. She has no wheezes.  Breast with 24 cm from clavicle at left breast.  Right 29cm.   Musculoskeletal:       Lumbar back: She exhibits bony tenderness (along the SI joint of the right.). She exhibits normal range of motion, no swelling, no spasm and normal pulse.  Neurological: She is alert and oriented to person, place, and time.  Skin: She is not diaphoretic.  Psychiatric: She has a normal mood and affect. Her behavior is normal.     Assessment and Plan: HESPER VENTURELLA is a 51 y.o. female who is here today for cc of  Chief Complaint  Patient presents with  . Back Pain    lower back pain; not sure what is causing it  . Other    abdominal discomfort  advised icing. Low dose one time NSAID use given et [place] history msk relaxants issued today We will see about obtaining a better covered bra and insertion cup.  Low back pain, unspecified back pain laterality, unspecified chronicity, with sciatica presence unspecified - Plan: POCT urinalysis dipstick, POCT Microscopic Urinalysis ([facility]), Urine Culture, cyclobenzaprine (FLEXERIL) 10 MG tablet, meloxicam (MOBIC) 7.5 MG tablet, CANCELED: POCT CBC  Pelvic pain - Plan: CANCELED: POCT CBC  Breast asymmetry in female  Ivar Drape, PA-C [facility] 10/16/201811:03 AM

## 2017-04-25 LAB — URINE CULTURE

## 2017-06-27 ENCOUNTER — Ambulatory Visit: Payer: BLUE CROSS/BLUE SHIELD | Admitting: Physician Assistant

## 2017-06-27 ENCOUNTER — Ambulatory Visit (INDEPENDENT_AMBULATORY_CARE_PROVIDER_SITE_OTHER): Payer: BLUE CROSS/BLUE SHIELD

## 2017-06-27 ENCOUNTER — Other Ambulatory Visit: Payer: Self-pay

## 2017-06-27 VITALS — BP 124/78 | HR 100 | Temp 98.4°F | Resp 16 | Ht 60.0 in | Wt 156.0 lb

## 2017-06-27 DIAGNOSIS — R058 Other specified cough: Secondary | ICD-10-CM

## 2017-06-27 DIAGNOSIS — R05 Cough: Secondary | ICD-10-CM

## 2017-06-27 DIAGNOSIS — R0600 Dyspnea, unspecified: Secondary | ICD-10-CM | POA: Diagnosis not present

## 2017-06-27 DIAGNOSIS — F411 Generalized anxiety disorder: Secondary | ICD-10-CM

## 2017-06-27 DIAGNOSIS — R062 Wheezing: Secondary | ICD-10-CM

## 2017-06-27 DIAGNOSIS — J189 Pneumonia, unspecified organism: Secondary | ICD-10-CM

## 2017-06-27 LAB — POCT CBC
GRANULOCYTE PERCENT: 70.3 % (ref 37–80)
HEMATOCRIT: 40.3 % (ref 37.7–47.9)
Hemoglobin: 13.8 g/dL (ref 12.2–16.2)
Lymph, poc: 1.9 (ref 0.6–3.4)
MCH: 30.4 pg (ref 27–31.2)
MCHC: 34.3 g/dL (ref 31.8–35.4)
MCV: 88.7 fL (ref 80–97)
MID (CBC): 0.6 (ref 0–0.9)
MPV: 7.5 fL (ref 0–99.8)
POC GRANULOCYTE: 5.8 (ref 2–6.9)
POC LYMPH %: 22.8 % (ref 10–50)
POC MID %: 6.9 % (ref 0–12)
Platelet Count, POC: 298 10*3/uL (ref 142–424)
RBC: 4.54 M/uL (ref 4.04–5.48)
RDW, POC: 13.4 %
WBC: 8.2 10*3/uL (ref 4.6–10.2)

## 2017-06-27 MED ORDER — METHYLPREDNISOLONE SODIUM SUCC 125 MG IJ SOLR
80.0000 mg | Freq: Once | INTRAMUSCULAR | Status: AC
  Administered 2017-06-27: 80 mg via INTRAVENOUS

## 2017-06-27 MED ORDER — AZITHROMYCIN 250 MG PO TABS: ORAL_TABLET | ORAL | 0 refills | Status: DC

## 2017-06-27 MED ORDER — ALBUTEROL SULFATE (2.5 MG/3ML) 0.083% IN NEBU
2.5000 mg | INHALATION_SOLUTION | Freq: Once | RESPIRATORY_TRACT | Status: AC
  Administered 2017-06-27: 2.5 mg via RESPIRATORY_TRACT

## 2017-06-27 MED ORDER — IPRATROPIUM BROMIDE 0.02 % IN SOLN
0.5000 mg | Freq: Once | RESPIRATORY_TRACT | Status: AC
  Administered 2017-06-27: 0.5 mg via RESPIRATORY_TRACT

## 2017-06-27 MED ORDER — GUAIFENESIN ER 1200 MG PO TB12: 1.0000 | ORAL_TABLET | Freq: Two times a day (BID) | ORAL | 1 refills | Status: DC | PRN

## 2017-06-27 MED ORDER — PREDNISONE 20 MG PO TABS: ORAL_TABLET | ORAL | 0 refills | Status: DC

## 2017-06-27 MED ORDER — LORAZEPAM 0.5 MG PO TABS: ORAL_TABLET | ORAL | 0 refills | Status: DC

## 2017-06-27 MED ORDER — ALBUTEROL SULFATE HFA 108 (90 BASE) MCG/ACT IN AERS: 2.0000 | INHALATION_SPRAY | RESPIRATORY_TRACT | 1 refills | Status: DC | PRN

## 2017-06-27 NOTE — Progress Notes (Addendum)
PRIMARY CARE AT [place], [place] [address] [phone]  Date:  06/27/2017   Name:  Ruth Perez   DOB:  [birth year]   MRN:  [id number]  PCP:  Joretta Bachelor, PA    History of Present Illness:  Ruth Perez is a 51 y.o. female patient who presents to PCP with  Chief Complaint  Patient presents with  . Cough    chest congestion and wheezing/ x 2 days  . Sinusitis     Patient reports that she has had cough for several days, but has progressively worsened to wheezing and feeling that she can not breathe over the last 2 days.  Productive white sputum with cough.  Congestion.  Some ear discomfort.  Feels facial pressure at times.  No fatigue, malaise.  No fever.  She is currently not smoking at this time.  6 beers per night.     Patient Active Problem List   Diagnosis Date Noted  . Leukocytosis 02/17/2015  . Diverticulosis 02/17/2015  . Fever 02/17/2015  . SIRS (systemic inflammatory response syndrome) ([place]) 02/17/2015  . Alcohol abuse 02/17/2015  . Acute diverticulitis 02/17/2015  . Pancreatic lesion 02/17/2015  . Colonic diverticular abscess 02/17/2015  . Hematuria 03/01/2014  . Smoker unmotivated to quit 04/20/2013  . Essential hypertension 10/09/2012  . Arthritis of neck 10/09/2012  . EtOH dependence ([place]) 10/09/2012    Past Medical History:  Diagnosis Date  . Alcohol abuse   . Arthritis   . Diverticulitis   . Hyperlipidemia   . Hypertension   . Pancreatic cyst     Past Surgical History:  Procedure Laterality Date  . MOUTH SURGERY    . TONSILLECTOMY      Social History   Tobacco Use  . Smoking status: Former Smoker    Packs/day: 1.00    Years: 35.00    Pack years: 35.00    Types: Cigarettes  . Smokeless tobacco: Never Used  . Tobacco comment: e cig  Substance Use Topics  . Alcohol use: Yes    Alcohol/week: 29.4 oz    Types: 49 Standard drinks or equivalent per week  . Drug use: Yes    Types: Marijuana    Family  History  Problem Relation Age of Onset  . Heart disease Mother   . Thyroid cancer Mother   . Kidney disease Father   . Colon polyps Father   . Heart disease Father   . Aneurysm Father        AAA  . Neuropathy Father     Allergies  Allergen Reactions  . Codeine Nausea And Vomiting    Medication list has been reviewed and updated.  Current Outpatient Medications on File Prior to Visit  Medication Sig Dispense Refill  . colchicine 0.6 MG tablet Take 1 tablet (0.6 mg total) by mouth daily. 90 tablet 1  . lisinopril-hydrochlorothiazide (PRINZIDE,ZESTORETIC) 10-12.5 MG tablet Take 1 tablet by mouth daily. 90 tablet 1  . cyclobenzaprine (FLEXERIL) 10 MG tablet Take 0.5-1 tablets (5-10 mg total) by mouth 3 (three) times daily as needed. (Patient not taking: Reported on 06/27/2017) 30 tablet 0  . LORazepam (ATIVAN) 0.5 MG tablet One to 2 tablets as needed for stress every 8 hours (Patient not taking: Reported on 06/27/2017) 30 tablet 0  . meloxicam (MOBIC) 7.5 MG tablet Take 1-2 tablets (7.5-15 mg total) by mouth daily. (Patient not taking: Reported on 06/27/2017) 30 tablet 0  . UNABLE TO FIND Specialty bra made to  fit asymmetrical breasts. (Patient not taking: Reported on 06/27/2017) 1 each 0   No current facility-administered medications on file prior to visit.     ROS ROS otherwise unremarkable unless listed above.  Physical Examination: BP 124/78   Pulse 100   Temp 98.4 F (36.9 C) (Oral)   Resp 16   Ht 5' (1.524 m)   Wt 156 lb (70.8 kg)   LMP 02/18/2013   SpO2 96%   BMI 30.47 kg/m  Ideal Body Weight: Weight in (lb) to have BMI = 25: 127.7  Physical Exam  Constitutional: She is oriented to person, place, and time. She appears well-developed and well-nourished. No distress.  HENT:  Head: Normocephalic and atraumatic.  Right Ear: Tympanic membrane, external ear and ear canal normal.  Left Ear: Tympanic membrane, external ear and ear canal normal.  Nose: Mucosal edema  and rhinorrhea present. Right sinus exhibits no maxillary sinus tenderness and no frontal sinus tenderness. Left sinus exhibits no maxillary sinus tenderness and no frontal sinus tenderness.  Mouth/Throat: No uvula swelling. No oropharyngeal exudate, posterior oropharyngeal edema or posterior oropharyngeal erythema.  Eyes: Conjunctivae and EOM are normal. Pupils are equal, round, and reactive to light.  Cardiovascular: Normal rate, regular rhythm, normal heart sounds and intact distal pulses. Exam reveals no gallop, no distant heart sounds and no friction rub.  No murmur heard. Pulmonary/Chest: Effort normal. No accessory muscle usage. No apnea. No respiratory distress. She has no decreased breath sounds. She has wheezes (expiratory wheezing diffused throughout lungs). She has rhonchi.  Lymphadenopathy:       Head (right side): No submandibular, no tonsillar, no preauricular and no posterior auricular adenopathy present.       Head (left side): No submandibular, no tonsillar, no preauricular and no posterior auricular adenopathy present.  Neurological: She is alert and oriented to person, place, and time.  Skin: She is not diaphoretic.  Psychiatric: She has a normal mood and affect. Her behavior is normal.   Results for orders placed or performed in visit on 06/27/17  POCT CBC  Result Value Ref Range   WBC 8.2 4.6 - 10.2 K/uL   Lymph, poc 1.9 0.6 - 3.4   POC LYMPH PERCENT 22.8 10 - 50 %L   MID (cbc) 0.6 0 - 0.9   POC MID % 6.9 0 - 12 %M   POC Granulocyte 5.8 2 - 6.9   Granulocyte percent 70.3 37 - 80 %G   RBC 4.54 4.04 - 5.48 M/uL   Hemoglobin 13.8 12.2 - 16.2 g/dL   HCT, POC 40.3 37.7 - 47.9 %   MCV 88.7 80 - 97 fL   MCH, POC 30.4 27 - 31.2 pg   MCHC 34.3 31.8 - 35.4 g/dL   RDW, POC 13.4 %   Platelet Count, POC 298 142 - 424 K/uL   MPV 7.5 0 - 99.8 fL    Dg Chest 2 View  Result Date: 06/27/2017 CLINICAL DATA:  Productive cough. Rhonchi. Expiratory wheezing. Some difficulty  breathing. Former smoker. EXAM: CHEST  2 VIEW COMPARISON:  03/01/2014 FINDINGS: The cardiomediastinal silhouette is within normal limits. Aortic atherosclerosis is noted. There is mild peribronchial thickening. Patchy and streaky opacity is present in the right middle lobe with associated volume loss. No pleural effusion or pneumothorax is identified. No acute osseous abnormality is seen. IMPRESSION: Right middle lobe opacity which may reflect atelectasis or pneumonia. Followup PA and lateral chest X-ray is recommended in 3-4 weeks following trial of antibiotic therapy to ensure  resolution and exclude underlying malignancy. Electronically Signed   By: Logan Bores M.D.   On: 06/27/2017 12:19    Assessment and Plan: KENDLE TURBIN is a 51 y.o. female who is here today for cc of  Chief Complaint  Patient presents with  . Cough    chest congestion and wheezing/ x 2 days  . Sinusitis   Has improved breath sounds with nebulizing treatment.  Given solu medrol at visit.  Will start oral prednisone in 24 hours.   She will start abx.  Advised of alarming symptoms to warrant immediate return.  Follow up in 4 days.  Also advised that she will need to return for follow up xray in 3-4 weeks.   Pneumonia due to infectious organism, unspecified laterality, unspecified part of lung - Plan: azithromycin (ZITHROMAX) 250 MG tablet, predniSONE (DELTASONE) 20 MG tablet, methylPREDNISolone sodium succinate (SOLU-MEDROL) 125 mg/2 mL injection 80 mg  Wheezing - Plan: albuterol (PROVENTIL) (2.5 MG/3ML) 0.083% nebulizer solution 2.5 mg, ipratropium (ATROVENT) nebulizer solution 0.5 mg, POCT CBC, methylPREDNISolone sodium succinate (SOLU-MEDROL) 125 mg/2 mL injection 80 mg  Dyspnea, unspecified type - Plan: albuterol (PROVENTIL) (2.5 MG/3ML) 0.083% nebulizer solution 2.5 mg, ipratropium (ATROVENT) nebulizer solution 0.5 mg, POCT CBC, methylPREDNISolone sodium succinate (SOLU-MEDROL) 125 mg/2 mL injection 80  mg  Productive cough - Plan: DG Chest 2 View, albuterol (PROVENTIL) (2.5 MG/3ML) 0.083% nebulizer solution 2.5 mg, ipratropium (ATROVENT) nebulizer solution 0.5 mg, POCT CBC, methylPREDNISolone sodium succinate (SOLU-MEDROL) 125 mg/2 mL injection 80 mg  Anxiety state - Plan: Guaifenesin (MUCINEX MAXIMUM STRENGTH) 1200 MG TB12, LORazepam (ATIVAN) 0.5 MG tablet, methylPREDNISolone sodium succinate (SOLU-MEDROL) 125 mg/2 mL injection 80 mg  Ivar Drape, PA-C [facility] 12/15/20188:52 PM

## 2017-06-27 NOTE — Patient Instructions (Addendum)
I would like you to take antibiotic as prescribed. I am prescribing you steroid which I want you to start tomorrow morning.  This is prednisone.  Follow the tapering dose.   I would like you to take the antibiotic as prescribed. I need you to follow up on Monday.  You can only miss this if your are 95% better.  If you decide not to return, you must come back in 3-4 weeks for xray so we can make sure that this opacity is gone.   Please take medications as prescribed.  Community-Acquired Pneumonia, Adult Pneumonia is an infection of the lungs. One type of pneumonia can happen while a person is in a hospital. A different type can happen when a person is not in a hospital (community-acquired pneumonia). It is easy for this kind to spread from person to person. It can spread to you if you breathe near an infected person who coughs or sneezes. Some symptoms include:  A dry cough.  A wet (productive) cough.  Fever.  Sweating.  Chest pain.  Follow these instructions at home:  Take over-the-counter and prescription medicines only as told by your doctor. ? Only take cough medicine if you are losing sleep. ? If you were prescribed an antibiotic medicine, take it as told by your doctor. Do not stop taking the antibiotic even if you start to feel better.  Sleep with your head and neck raised (elevated). You can do this by putting a few pillows under your head, or you can sleep in a recliner.  Do not use tobacco products. These include cigarettes, chewing tobacco, and e-cigarettes. If you need help quitting, ask your doctor.  Drink enough water to keep your pee (urine) clear or pale yellow. A shot (vaccine) can help prevent pneumonia. Shots are often suggested for:  People older than 53 years of age.  People older than 53 years of age: ? Who are having cancer treatment. ? Who have long-term (chronic) lung disease. ? Who have problems with their body's defense system (immune system).  You  may also prevent pneumonia if you take these actions:  Get the flu (influenza) shot every year.  Go to the dentist as often as told.  Wash your hands often. If soap and water are not available, use hand sanitizer.  Contact a doctor if:  You have a fever.  You lose sleep because your cough medicine does not help. Get help right away if:  You are short of breath and it gets worse.  You have more chest pain.  Your sickness gets worse. This is very serious if: ? You are an older adult. ? Your body's defense system is weak.  You cough up blood. This information is not intended to replace advice given to you by your health care provider. Make sure you discuss any questions you have with your health care provider. Document Released: 12/19/2007 Document Revised: 12/08/2015 Document Reviewed: 10/27/2014 Elsevier Interactive Patient Education  2018 [employer].    IF you received an x-ray today, you will receive an invoice from [facility]. Please contact [facility] at [phone] with questions or concerns regarding your invoice.   IF you received labwork today, you will receive an invoice from [facility]. Please contact [facility] at [phone] with questions or concerns regarding your invoice.   Our billing staff will not be able to assist you with questions regarding bills from these companies.  You will be contacted with the lab results as soon as they are  available. The fastest way to get your results is to activate your My Chart account. Instructions are located on the last page of this paperwork. If you have not heard from us regarding the results in 2 weeks, please contact this office.      

## 2017-06-29 ENCOUNTER — Encounter: Payer: Self-pay | Admitting: Physician Assistant

## 2017-07-01 ENCOUNTER — Ambulatory Visit (INDEPENDENT_AMBULATORY_CARE_PROVIDER_SITE_OTHER): Payer: BLUE CROSS/BLUE SHIELD | Admitting: Physician Assistant

## 2017-07-01 ENCOUNTER — Ambulatory Visit: Payer: BLUE CROSS/BLUE SHIELD | Admitting: Physician Assistant

## 2017-07-01 VITALS — BP 116/77 | HR 82 | Temp 98.6°F | Resp 16 | Ht 60.0 in | Wt 156.0 lb

## 2017-07-01 DIAGNOSIS — J22 Unspecified acute lower respiratory infection: Secondary | ICD-10-CM

## 2017-07-01 DIAGNOSIS — R062 Wheezing: Secondary | ICD-10-CM | POA: Diagnosis not present

## 2017-07-01 MED ORDER — AMOXICILLIN-POT CLAVULANATE 875-125 MG PO TABS: 1.0000 | ORAL_TABLET | Freq: Two times a day (BID) | ORAL | 0 refills | Status: AC

## 2017-07-01 MED ORDER — IPRATROPIUM BROMIDE 0.02 % IN SOLN
0.5000 mg | Freq: Once | RESPIRATORY_TRACT | Status: AC
  Administered 2017-07-01: 0.5 mg via RESPIRATORY_TRACT

## 2017-07-01 MED ORDER — ALBUTEROL SULFATE (2.5 MG/3ML) 0.083% IN NEBU
2.5000 mg | INHALATION_SOLUTION | Freq: Once | RESPIRATORY_TRACT | Status: AC
  Administered 2017-07-01: 2.5 mg via RESPIRATORY_TRACT

## 2017-07-01 NOTE — Patient Instructions (Addendum)
  Continue to use the albuterol as needed. I am starting you on augmentin.  Please take as prescribed. This is to cover for other bacteria that could be the culprit of your pneumonia.     IF you received an x-ray today, you will receive an invoice from [facility]. Please contact [facility] at [phone] with questions or concerns regarding your invoice.   IF you received labwork today, you will receive an invoice from [facility]. Please contact [facility] at [phone] with questions or concerns regarding your invoice.   Our billing staff will not be able to assist you with questions regarding bills from these companies.  You will be contacted with the lab results as soon as they are available. The fastest way to get your results is to activate your My Chart account. Instructions are located on the last page of this paperwork. If you have not heard from [place] regarding the results in 2 weeks, please contact this office.

## 2017-07-01 NOTE — Progress Notes (Signed)
PRIMARY CARE AT [place], [place] [address] [phone]  Date:  07/01/2017   Name:  Ruth Perez   DOB:  1966/04/21   MRN:  [id number]  PCP:  Joretta Bachelor, PA    History of Present Illness:  Ruth Perez is a 51 y.o. female patient who presents to PCP with  Chief Complaint  Patient presents with  . Follow-up    cough/ pt feeling better     Patient is here for 4 day follow up with "right middle lobe opacity which may reflect atelecatsis or pneumonia". Follow up and doing a lot better at this time.  She at times feels short of breath.  Has coughing that is productive though she states that this has improved.   No fever She is using the albuterol as needed. Taking the azithromycin compliantly. Patient Active Problem List   Diagnosis Date Noted  . Leukocytosis 02/17/2015  . Diverticulosis 02/17/2015  . Fever 02/17/2015  . SIRS (systemic inflammatory response syndrome) ([place]) 02/17/2015  . Alcohol abuse 02/17/2015  . Acute diverticulitis 02/17/2015  . Pancreatic lesion 02/17/2015  . Colonic diverticular abscess 02/17/2015  . Hematuria 03/01/2014  . Smoker unmotivated to quit 04/20/2013  . Essential hypertension 10/09/2012  . Arthritis of neck 10/09/2012  . EtOH dependence ([place]) 10/09/2012    Past Medical History:  Diagnosis Date  . Alcohol abuse   . Arthritis   . Diverticulitis   . Hyperlipidemia   . Hypertension   . Pancreatic cyst     Past Surgical History:  Procedure Laterality Date  . MOUTH SURGERY    . TONSILLECTOMY      Social History   Tobacco Use  . Smoking status: Former Smoker    Packs/day: 1.00    Years: 35.00    Pack years: 35.00    Types: Cigarettes  . Smokeless tobacco: Never Used  . Tobacco comment: e cig  Substance Use Topics  . Alcohol use: Yes    Alcohol/week: 29.4 oz    Types: 49 Standard drinks or equivalent per week  . Drug use: Yes    Types: Marijuana    Family History  Problem Relation Age  of Onset  . Heart disease Mother   . Thyroid cancer Mother   . Kidney disease Father   . Colon polyps Father   . Heart disease Father   . Aneurysm Father        AAA  . Neuropathy Father     Allergies  Allergen Reactions  . Codeine Nausea And Vomiting    Medication list has been reviewed and updated.  Current Outpatient Medications on File Prior to Visit  Medication Sig Dispense Refill  . albuterol (PROVENTIL HFA;VENTOLIN HFA) 108 (90 Base) MCG/ACT inhaler Inhale 2 puffs into the lungs every 4 (four) hours as needed for wheezing or shortness of breath (cough, shortness of breath or wheezing.). 1 Inhaler 1  . colchicine 0.6 MG tablet Take 1 tablet (0.6 mg total) by mouth daily. 90 tablet 1  . Guaifenesin (MUCINEX MAXIMUM STRENGTH) 1200 MG TB12 Take 1 tablet (1,200 mg total) by mouth every 12 (twelve) hours as needed. 14 tablet 1  . lisinopril-hydrochlorothiazide (PRINZIDE,ZESTORETIC) 10-12.5 MG tablet Take 1 tablet by mouth daily. 90 tablet 1  . LORazepam (ATIVAN) 0.5 MG tablet One to 2 tablets as needed for stress every 8 hours 30 tablet 0  . meloxicam (MOBIC) 7.5 MG tablet Take 1-2 tablets (7.5-15 mg total) by mouth daily. 30 tablet  0  . predniSONE (DELTASONE) 20 MG tablet Take 3 PO QAM x2days, 2 PO QAM x2days, 1 PO QAM x3days 13 tablet 0  . UNABLE TO FIND Specialty bra made to fit asymmetrical breasts. 1 each 0  . azithromycin (ZITHROMAX) 250 MG tablet Take 2 tabs PO x 1 dose, then 1 tab PO QD x 4 days (Patient not taking: Reported on 07/01/2017) 6 tablet 0  . cyclobenzaprine (FLEXERIL) 10 MG tablet Take 0.5-1 tablets (5-10 mg total) by mouth 3 (three) times daily as needed. (Patient not taking: Reported on 06/27/2017) 30 tablet 0   No current facility-administered medications on file prior to visit.     ROS ROS otherwise unremarkable unless listed above.  Physical Examination: BP 116/77   Pulse 82   Temp 98.6 F (37 C) (Oral)   Resp 16   Ht 5' (1.524 m)   Wt 156 lb (70.8  kg)   LMP 02/18/2013   SpO2 96%   PF 300 L/min   BMI 30.47 kg/m  Ideal Body Weight: Weight in (lb) to have BMI = 25: 127.7  Physical Exam  Constitutional: She is oriented to person, place, and time. She appears well-developed and well-nourished. No distress.  HENT:  Head: Normocephalic and atraumatic.  Right Ear: External ear normal.  Left Ear: External ear normal.  Eyes: Conjunctivae and EOM are normal. Pupils are equal, round, and reactive to light.  Cardiovascular: Normal rate.  Pulmonary/Chest: Effort normal. No respiratory distress. She has wheezes (expiratory ).  Neurological: She is alert and oriented to person, place, and time.  Skin: She is not diaphoretic.  Psychiatric: She has a normal mood and affect. Her behavior is normal.     Assessment and Plan: Ruth Perez is a 51 y.o. female who is here today for cc of  Chief Complaint  Patient presents with  . Follow-up    cough/ pt feeling better  history of smoking and alcohol use, will cover with beta lactam abx.  Follow up in 3 weeks for recheck pneumonia.  Discussed alarming symptoms to warrant an immediate return. Lower respiratory infection (e.g., bronchitis, pneumonia, pneumonitis, pulmonitis) - Plan: amoxicillin-clavulanate (AUGMENTIN) 875-125 MG tablet  Wheezing - Plan: albuterol (PROVENTIL) (2.5 MG/3ML) 0.083% nebulizer solution 2.5 mg, ipratropium (ATROVENT) nebulizer solution 0.5 mg, amoxicillin-clavulanate (AUGMENTIN) 875-125 MG tablet  Ivar Drape, PA-C [facility] 12/29/20185:59 PM

## 2017-07-19 ENCOUNTER — Encounter: Payer: Self-pay | Admitting: Gastroenterology

## 2017-07-22 ENCOUNTER — Ambulatory Visit: Payer: BLUE CROSS/BLUE SHIELD | Admitting: Physician Assistant

## 2017-07-22 ENCOUNTER — Encounter: Payer: Self-pay | Admitting: Physician Assistant

## 2017-07-22 ENCOUNTER — Other Ambulatory Visit: Payer: Self-pay

## 2017-07-22 ENCOUNTER — Ambulatory Visit (INDEPENDENT_AMBULATORY_CARE_PROVIDER_SITE_OTHER): Payer: BLUE CROSS/BLUE SHIELD

## 2017-07-22 VITALS — BP 118/72 | HR 87 | Temp 98.0°F | Resp 16 | Ht 60.0 in | Wt 159.0 lb

## 2017-07-22 DIAGNOSIS — R062 Wheezing: Secondary | ICD-10-CM | POA: Diagnosis not present

## 2017-07-22 DIAGNOSIS — Z8701 Personal history of pneumonia (recurrent): Secondary | ICD-10-CM | POA: Diagnosis not present

## 2017-07-22 DIAGNOSIS — R9389 Abnormal findings on diagnostic imaging of other specified body structures: Secondary | ICD-10-CM | POA: Diagnosis not present

## 2017-07-22 NOTE — Progress Notes (Signed)
PRIMARY CARE AT [place], [place] [address] [phone]  Date:  07/22/2017   Name:  Ruth Perez   DOB:  January 23, 1966   MRN:  [id number]  PCP:  Joretta Bachelor, PA    History of Present Illness:  Ruth Perez is a 52 y.o. female patient who presents to PCP with  Chief Complaint  Patient presents with  . Follow-up    pnuemonia     Patient is here for follow up chest xray.  She was seen here about 1 month ago for pneumonia.  abx completed (azithromycin and augmentin).  She reports that her symptoms have completely resolved.  No dyspnea, cough, or fatigue.  No fever.   Previous xray showed right middle lobe opacity was recommended to follow up in 3-4 weeks.   Patient Active Problem List   Diagnosis Date Noted  . Leukocytosis 02/17/2015  . Diverticulosis 02/17/2015  . Fever 02/17/2015  . SIRS (systemic inflammatory response syndrome) ([place]) 02/17/2015  . Alcohol abuse 02/17/2015  . Acute diverticulitis 02/17/2015  . Pancreatic lesion 02/17/2015  . Colonic diverticular abscess 02/17/2015  . Hematuria 03/01/2014  . Smoker unmotivated to quit 04/20/2013  . Essential hypertension 10/09/2012  . Arthritis of neck 10/09/2012  . EtOH dependence ([place]) 10/09/2012    Past Medical History:  Diagnosis Date  . Alcohol abuse   . Arthritis   . Diverticulitis   . Hyperlipidemia   . Hypertension   . Pancreatic cyst     Past Surgical History:  Procedure Laterality Date  . MOUTH SURGERY    . TONSILLECTOMY      Social History   Tobacco Use  . Smoking status: Former Smoker    Packs/day: 1.00    Years: 35.00    Pack years: 35.00    Types: Cigarettes  . Smokeless tobacco: Never Used  . Tobacco comment: e cig  Substance Use Topics  . Alcohol use: Yes    Alcohol/week: 29.4 oz    Types: 49 Standard drinks or equivalent per week  . Drug use: Yes    Types: Marijuana    Family History  Problem Relation Age of Onset  . Heart disease Mother   .  Thyroid cancer Mother   . Kidney disease Father   . Colon polyps Father   . Heart disease Father   . Aneurysm Father        AAA  . Neuropathy Father     Allergies  Allergen Reactions  . Codeine Nausea And Vomiting    Medication list has been reviewed and updated.  Current Outpatient Medications on File Prior to Visit  Medication Sig Dispense Refill  . albuterol (PROVENTIL HFA;VENTOLIN HFA) 108 (90 Base) MCG/ACT inhaler Inhale 2 puffs into the lungs every 4 (four) hours as needed for wheezing or shortness of breath (cough, shortness of breath or wheezing.). 1 Inhaler 1  . colchicine 0.6 MG tablet Take 1 tablet (0.6 mg total) by mouth daily. 90 tablet 1  . Guaifenesin (MUCINEX MAXIMUM STRENGTH) 1200 MG TB12 Take 1 tablet (1,200 mg total) by mouth every 12 (twelve) hours as needed. 14 tablet 1  . lisinopril-hydrochlorothiazide (PRINZIDE,ZESTORETIC) 10-12.5 MG tablet Take 1 tablet by mouth daily. 90 tablet 1  . LORazepam (ATIVAN) 0.5 MG tablet One to 2 tablets as needed for stress every 8 hours 30 tablet 0  . meloxicam (MOBIC) 7.5 MG tablet Take 1-2 tablets (7.5-15 mg total) by mouth daily. 30 tablet 0  . predniSONE (DELTASONE) 20  MG tablet Take 3 PO QAM x2days, 2 PO QAM x2days, 1 PO QAM x3days 13 tablet 0  . UNABLE TO FIND Specialty bra made to fit asymmetrical breasts. 1 each 0  . azithromycin (ZITHROMAX) 250 MG tablet Take 2 tabs PO x 1 dose, then 1 tab PO QD x 4 days (Patient not taking: Reported on 07/01/2017) 6 tablet 0  . cyclobenzaprine (FLEXERIL) 10 MG tablet Take 0.5-1 tablets (5-10 mg total) by mouth 3 (three) times daily as needed. (Patient not taking: Reported on 06/27/2017) 30 tablet 0   No current facility-administered medications on file prior to visit.     ROS ROS otherwise unremarkable unless listed above.  Physical Examination: BP 118/72   Pulse 87   Temp 98 F (36.7 C) (Oral)   Resp 16   Ht 5' (1.524 m)   Wt 159 lb (72.1 kg)   LMP 02/18/2013   SpO2 99%    BMI 31.05 kg/m  Ideal Body Weight: Weight in (lb) to have BMI = 25: 127.7  Physical Exam  Dg Chest 2 View  Result Date: 07/22/2017 WHEEZE A CLINICAL DATA: Wheezing EXAM: CHEST  2 VIEW COMPARISON:  06/27/2017 FINDINGS: Previously seen right middle lobe infiltrate has resolved. Heart and mediastinal contours are within normal limits. No focal opacities or effusions. No acute bony abnormality. IMPRESSION: No active cardiopulmonary disease. Electronically Signed   By: Rolm Baptise M.D.   On: 07/22/2017 15:57   Dg Chest 2 View  Result Date: 06/27/2017 CLINICAL DATA:  Productive cough. Rhonchi. Expiratory wheezing. Some difficulty breathing. Former smoker. EXAM: CHEST  2 VIEW COMPARISON:  03/01/2014 FINDINGS: The cardiomediastinal silhouette is within normal limits. Aortic atherosclerosis is noted. There is mild peribronchial thickening. Patchy and streaky opacity is present in the right middle lobe with associated volume loss. No pleural effusion or pneumothorax is identified. No acute osseous abnormality is seen. IMPRESSION: Right middle lobe opacity which may reflect atelectasis or pneumonia. Followup PA and lateral chest X-ray is recommended in 3-4 weeks following trial of antibiotic therapy to ensure resolution and exclude underlying malignancy. Electronically Signed   By: Logan Bores M.D.   On: 06/27/2017 12:19     Assessment and Plan: Ruth Perez is a 52 y.o. female who is here today for cc of  Chief Complaint  Patient presents with  . Follow-up    pnuemonia  --resolved.  Follow up as needed. --blood pressure able to call in for an additional 3 months of anti-hypertensive from today.  History of pneumonia - Plan: DG Chest 2 View  Abnormal chest x-ray - Plan: DG Chest 2 View  Ivar Drape, PA-C [facility] 1/7/20195:08 PM

## 2017-07-22 NOTE — Patient Instructions (Signed)
     IF you received an x-ray today, you will receive an invoice from [facility]. Please contact [facility] at [phone] with questions or concerns regarding your invoice.   IF you received labwork today, you will receive an invoice from [facility]. Please contact [facility] at [phone] with questions or concerns regarding your invoice.   Our billing staff will not be able to assist you with questions regarding bills from these companies.  You will be contacted with the lab results as soon as they are available. The fastest way to get your results is to activate your My Chart account. Instructions are located on the last page of this paperwork. If you have not heard from us regarding the results in 2 weeks, please contact this office.     

## 2017-09-25 ENCOUNTER — Other Ambulatory Visit: Payer: Self-pay | Admitting: Physician Assistant

## 2017-09-25 DIAGNOSIS — M1A9XX0 Chronic gout, unspecified, without tophus (tophi): Secondary | ICD-10-CM

## 2017-09-25 DIAGNOSIS — I10 Essential (primary) hypertension: Secondary | ICD-10-CM

## 2017-09-26 NOTE — Telephone Encounter (Addendum)
Lisinopril-HCTZ refill Last OV: 03/12/17 Last Refill:03/12/17 Pharmacy:[facility] on [address] PCP: Colletta [place] English PA  Colcrys refill Last OV: 03/12/17 Last Refill:03/12/17 Pharmacy:[facility] [address] PCP: Ivar Drape PA

## 2017-10-16 ENCOUNTER — Encounter: Payer: Self-pay | Admitting: Physician Assistant

## 2017-11-01 ENCOUNTER — Ambulatory Visit (INDEPENDENT_AMBULATORY_CARE_PROVIDER_SITE_OTHER): Payer: BLUE CROSS/BLUE SHIELD | Admitting: Family Medicine

## 2017-11-01 ENCOUNTER — Encounter: Payer: Self-pay | Admitting: Family Medicine

## 2017-11-01 ENCOUNTER — Other Ambulatory Visit: Payer: Self-pay

## 2017-11-01 VITALS — BP 108/60 | HR 87 | Temp 98.7°F | Resp 16 | Ht 61.1 in | Wt 156.1 lb

## 2017-11-01 DIAGNOSIS — M109 Gout, unspecified: Secondary | ICD-10-CM

## 2017-11-01 DIAGNOSIS — M1A09X0 Idiopathic chronic gout, multiple sites, without tophus (tophi): Secondary | ICD-10-CM | POA: Diagnosis not present

## 2017-11-01 MED ORDER — INDOMETHACIN 50 MG PO CAPS: 50.0000 mg | ORAL_CAPSULE | Freq: Three times a day (TID) | ORAL | 1 refills | Status: DC

## 2017-11-01 NOTE — Progress Notes (Signed)
4/19/201911:52 AM  Ruth Perez 07-14-1966, 52 y.o. female [id number]  Chief Complaint  Patient presents with  . Toe Pain    right, gout     HPI:   Patient is a 52 y.o. female with past medical history significant for gout who presents today for right great toe pain that started yesterday. It was very tender, mildly red. No trauma. Ate shrimp a week ago. Has been taking colchicine daily for several weeks now as she continues to experience very mild attacks. Has never been on allopurinol. Continues to drink alcohol, last night she had 6 beers.   Uric acid 11.9 - 8/18 Normal crt 8/18  Depression screen [facility] 2/9 11/01/2017 07/22/2017 07/22/2017  Decreased Interest 0 0 0  Down, Depressed, Hopeless 0 0 0  PHQ - 2 Score 0 0 0    Allergies  Allergen Reactions  . Codeine Nausea And Vomiting    Prior to Admission medications   Medication Sig Start Date End Date Taking? Authorizing Provider  COLCRYS 0.6 MG tablet TAKE 1 TABLET BY MOUTH ONCE DAILY 09/26/17  Yes English, Stephanie D, PA  lisinopril-hydrochlorothiazide (PRINZIDE,ZESTORETIC) 10-12.5 MG tablet TAKE 1 TABLET BY MOUTH ONCE DAILY 09/26/17  Yes English, Stephanie D, PA  LORazepam (ATIVAN) 0.5 MG tablet One to 2 tablets as needed for stress every 8 hours 06/27/17  Yes Joretta Bachelor, PA    Past Medical History:  Diagnosis Date  . Alcohol abuse   . Arthritis   . Diverticulitis   . Hyperlipidemia   . Hypertension   . Pancreatic cyst     Past Surgical History:  Procedure Laterality Date  . MOUTH SURGERY    . TONSILLECTOMY      Social History   Tobacco Use  . Smoking status: Former Smoker    Packs/day: 1.00    Years: 35.00    Pack years: 35.00    Types: Cigarettes  . Smokeless tobacco: Never Used  . Tobacco comment: e cig  Substance Use Topics  . Alcohol use: Yes    Alcohol/week: 29.4 oz    Types: 49 Standard drinks or equivalent per week    Family History  Problem Relation Age of Onset  . Heart  disease Mother   . Thyroid cancer Mother   . Kidney disease Father   . Colon polyps Father   . Heart disease Father   . Aneurysm Father        AAA  . Neuropathy Father     ROS Per hpi  OBJECTIVE:  Blood pressure 108/60, pulse 87, temperature 98.7 F (37.1 C), resp. rate 16, height 5' 1.1" (1.552 m), weight 156 lb 1.6 oz (70.8 kg), last menstrual period 02/18/2013, SpO2 98 %.  Physical Exam  Constitutional: She is oriented to person, place, and time. She appears well-developed and well-nourished.  HENT:  Head: Normocephalic and atraumatic.  Mouth/Throat: Mucous membranes are normal.  Eyes: Pupils are equal, round, and reactive to light. EOM are normal. No scleral icterus.  Neck: Neck supple.  Pulmonary/Chest: Effort normal.  Musculoskeletal:  Right great toe MCP joint swollen, very TTP, minimal erythema  Neurological: She is alert and oriented to person, place, and time.  Skin: Skin is warm and dry.  Psychiatric: She has a normal mood and affect.  Nursing note and vitals reviewed.    ASSESSMENT and PLAN  1. Acute gout involving toe of right foot, unspecified cause Gout not controlled, discussed importance of diet, incl alcohol as triggers. Discussed starting allopurinol  when not having a flare up. Discussed new meds r/se/b and RTC precautions. Patient educational handout given. - indomethacin (INDOCIN) 50 MG capsule; Take 1 capsule (50 mg total) by mouth 3 (three) times daily with meals.  Return in about 2 weeks (around 11/15/2017).    Rutherford Guys, MD Primary Care at [place],  [address] Ph.  [phone] Fax [phone]

## 2017-11-01 NOTE — Patient Instructions (Addendum)
   IF you received an x-ray today, you will receive an invoice from [facility]. Please contact [facility] at [phone] with questions or concerns regarding your invoice.   IF you received labwork today, you will receive an invoice from [facility]. Please contact [facility] at [phone] with questions or concerns regarding your invoice.   Our billing staff will not be able to assist you with questions regarding bills from these companies.  You will be contacted with the lab results as soon as they are available. The fastest way to get your results is to activate your My Chart account. Instructions are located on the last page of this paperwork. If you have not heard from us regarding the results in 2 weeks, please contact this office.     Gout Gout is painful swelling that can occur in some of your joints. Gout is a type of arthritis. This condition is caused by having too much uric acid in your body. Uric acid is a chemical that forms when your body breaks down substances called purines. Purines are important for building body proteins. When your body has too much uric acid, sharp crystals can form and build up inside your joints. This causes pain and swelling. Gout attacks can happen quickly and be very painful (acute gout). Over time, the attacks can affect more joints and become more frequent (chronic gout). Gout can also cause uric acid to build up under your skin and inside your kidneys. What are the causes? This condition is caused by too much uric acid in your blood. This can occur because:  Your kidneys do not remove enough uric acid from your blood. This is the most common cause.  Your body makes too much uric acid. This can occur with some cancers and cancer treatments. It can also occur if your body is breaking down too many red blood cells (hemolytic anemia).  You eat too many foods that are high in purines. These foods include organ meats and some seafood.  Alcohol, especially beer, is also high in purines.  A gout attack may be triggered by trauma or stress. What increases the risk? This condition is more likely to develop in people who:  Have a family history of gout.  Are female and middle-aged.  Are female and have gone through menopause.  Are obese.  Frequently drink alcohol, especially beer.  Are dehydrated.  Lose weight too quickly.  Have an organ transplant.  Have lead poisoning.  Take certain medicines, including aspirin, cyclosporine, diuretics, levodopa, and niacin.  Have kidney disease or psoriasis.  What are the signs or symptoms? An attack of acute gout happens quickly. It usually occurs in just one joint. The most common place is the big toe. Attacks often start at night. Other joints that may be affected include joints of the feet, ankle, knee, fingers, wrist, or elbow. Symptoms may include:  Severe pain.  Warmth.  Swelling.  Stiffness.  Tenderness. The affected joint may be very painful to touch.  Shiny, red, or purple skin.  Chills and fever.  Chronic gout may cause symptoms more frequently. More joints may be involved. You may also have white or yellow lumps (tophi) on your hands or feet or in other areas near your joints. How is this diagnosed? This condition is diagnosed based on your symptoms, medical history, and physical exam. You may have tests, such as:  Blood tests to measure uric acid levels.  Removal of joint fluid with a needle (aspiration) to   look for uric acid crystals.  X-rays to look for joint damage.  How is this treated? Treatment for this condition has two phases: treating an acute attack and preventing future attacks. Acute gout treatment may include medicines to reduce pain and swelling, including:  NSAIDs.  Steroids. These are strong anti-inflammatory medicines that can be taken by mouth (orally) or injected into a joint.  Colchicine. This medicine relieves pain and  swelling when it is taken soon after an attack. It can be given orally or through an IV tube.  Preventive treatment may include:  Daily use of smaller doses of NSAIDs or colchicine.  Use of a medicine that reduces uric acid levels in your blood.  Changes to your diet. You may need to see a specialist about healthy eating (dietitian).  Follow these instructions at home: During a Gout Attack  If directed, apply ice to the affected area: ? Put ice in a plastic bag. ? Place a towel between your skin and the bag. ? Leave the ice on for 20 minutes, 2-3 times a day.  Rest the joint as much as possible. If the affected joint is in your leg, you may be given crutches to use.  Raise (elevate) the affected joint above the level of your heart as often as possible.  Drink enough fluids to keep your urine clear or pale yellow.  Take over-the-counter and prescription medicines only as told by your health care provider.  Do not drive or operate heavy machinery while taking prescription pain medicine.  Follow instructions from your health care provider about eating or drinking restrictions.  Return to your normal activities as told by your health care provider. Ask your health care provider what activities are safe for you. Avoiding Future Gout Attacks  Follow a low-purine diet as told by your dietitian or health care provider. Avoid foods and drinks that are high in purines, including liver, kidney, anchovies, asparagus, herring, mushrooms, mussels, and beer.  Limit alcohol intake to no more than 1 drink a day for nonpregnant women and 2 drinks a day for men. One drink equals 12 oz of beer, 5 oz of wine, or 1 oz of hard liquor.  Maintain a healthy weight or lose weight if you are overweight. If you want to lose weight, talk with your health care provider. It is important that you do not lose weight too quickly.  Start or maintain an exercise program as told by your health care  provider.  Drink enough fluids to keep your urine clear or pale yellow.  Take over-the-counter and prescription medicines only as told by your health care provider.  Keep all follow-up visits as told by your health care provider. This is important. Contact a health care provider if:  You have another gout attack.  You continue to have symptoms of a gout attack after10 days of treatment.  You have side effects from your medicines.  You have chills or a fever.  You have burning pain when you urinate.  You have pain in your lower back or belly. Get help right away if:  You have severe or uncontrolled pain.  You cannot urinate. This information is not intended to replace advice given to you by your health care provider. Make sure you discuss any questions you have with your health care provider. Document Released: 06/29/2000 Document Revised: 12/08/2015 Document Reviewed: 04/14/2015 Elsevier Interactive Patient Education  2018 Elsevier Inc.  

## 2017-11-11 ENCOUNTER — Telehealth: Payer: Self-pay | Admitting: Gastroenterology

## 2017-11-11 NOTE — Telephone Encounter (Signed)
The pt states she is having some abd pain, better today.  She has an appt with PCP on Friday.  She will keep PCP appt and has a f/u with Dr Ardis Hughs 6/14.  She will call if she has any worse symptoms.

## 2017-11-12 ENCOUNTER — Encounter: Payer: Self-pay | Admitting: Physician Assistant

## 2017-11-12 ENCOUNTER — Ambulatory Visit: Payer: BLUE CROSS/BLUE SHIELD | Admitting: Physician Assistant

## 2017-11-12 VITALS — BP 107/72 | HR 74 | Temp 97.3°F | Resp 17 | Ht 61.0 in | Wt 155.0 lb

## 2017-11-12 DIAGNOSIS — M109 Gout, unspecified: Secondary | ICD-10-CM | POA: Diagnosis not present

## 2017-11-12 MED ORDER — PREDNISONE 10 MG PO TABS: ORAL_TABLET | ORAL | 0 refills | Status: DC

## 2017-11-12 NOTE — Progress Notes (Signed)
Ruth Perez  MRN: [id number] DOB: [birth year]  PCP: Joretta Bachelor, PA  Subjective:  Pt is a 52 year old female who presents to clinic for f/u gout.  She was here on 4/19 c/o right great toe pain x 1 day, dx with gout and Rx for indomethacin. She took indomethacin for about 3 days but stopped due to dizziness and fatigue. Toe got better last week. She c/o possible diverticulitis symptoms 3 days ago. Yesterday she was fine.  Today c/o pain of right great toe and left elbow. Her abdominal pain has resolved.  She celebrated orthodox easter holiday this past weekend.    Review of Systems  Constitutional: Negative for chills, diaphoresis, fatigue and fever.  Musculoskeletal: Positive for arthralgias. Negative for joint swelling.  Skin: Negative for rash and wound.    Patient Active Problem List   Diagnosis Date Noted  . Gout 11/01/2017  . Leukocytosis 02/17/2015  . Diverticulosis 02/17/2015  . Fever 02/17/2015  . SIRS (systemic inflammatory response syndrome) ([place]) 02/17/2015  . Alcohol abuse 02/17/2015  . Acute diverticulitis 02/17/2015  . Pancreatic lesion 02/17/2015  . Colonic diverticular abscess 02/17/2015  . Hematuria 03/01/2014  . Smoker unmotivated to quit 04/20/2013  . Essential hypertension 10/09/2012  . Arthritis of neck 10/09/2012  . EtOH dependence ([place]) 10/09/2012    Current Outpatient Medications on File Prior to Visit  Medication Sig Dispense Refill  . COLCRYS 0.6 MG tablet TAKE 1 TABLET BY MOUTH ONCE DAILY 90 tablet 1  . indomethacin (INDOCIN) 50 MG capsule Take 1 capsule (50 mg total) by mouth 3 (three) times daily with meals. 30 capsule 1  . lisinopril-hydrochlorothiazide (PRINZIDE,ZESTORETIC) 10-12.5 MG tablet TAKE 1 TABLET BY MOUTH ONCE DAILY 90 tablet 1  . LORazepam (ATIVAN) 0.5 MG tablet One to 2 tablets as needed for stress every 8 hours 30 tablet 0   No current facility-administered medications on file prior to visit.     Allergies    Allergen Reactions  . Codeine Nausea And Vomiting     Objective:  BP 107/72   Pulse 74   Temp (!) 97.3 F (36.3 C) (Oral)   Resp 17   Ht 5\' 1"  (1.549 m)   Wt 155 lb (70.3 kg)   LMP 02/18/2013   SpO2 98%   BMI 29.29 kg/m   Physical Exam  Constitutional: She is oriented to person, place, and time. No distress.  Musculoskeletal:       Left elbow: She exhibits swelling (very mild erythema). She exhibits normal range of motion. Tenderness found. Lateral epicondyle tenderness noted.       Right foot: There is bony tenderness. There is normal range of motion.       Feet:  Neurological: She is alert and oriented to person, place, and time.  Skin: Skin is warm and dry.  Psychiatric: Judgment normal.  Vitals reviewed.   Assessment and Plan :  1. Acute gout involving toe of right foot, unspecified cause - predniSONE (DELTASONE) 10 MG tablet; Take 40 mg daily x 4 days; 30mg  x 4 days; 20mg  x 3 days; 10 mg x 2 days  Dispense: 36 tablet; Refill: 0 - pt recently treated for gout attack with indomethacin, which she d/c'd early due to side effects. Plan to treat with prednisone taper. Advised pt to f/u with Dr. Pamella Pert to continue their discussion of starting allopurinol when she is not having a flare. Dietary modifications encouraged.   Mercer Pod, PA-C  Primary Care at [facility] 11/12/2017 2:45 PM

## 2017-11-12 NOTE — Patient Instructions (Addendum)
For gout: Start taking Prednisone 40 mg once daily for 4 days; then 30mg  daily x 4 days; then 20mg  daily x 3 days; then 10mg  daily x 2 days.  Once your pain is gone, come back and see Dr. Pamella Pert to discuss Allopurinol.   Call you insurance and ask them which bra is covered for asymmetrical breasts.  (Breast measurements 24 cm at left breast.  Right 29cm.)  Low-Purine Diet Purines are compounds that affect the level of uric acid in your body. A low-purine diet is a diet that is low in purines. Eating a low-purine diet can prevent the level of uric acid in your body from getting too high and causing gout or kidney stones or both. What do I need to know about this diet?  Choose low-purine foods. Examples of low-purine foods are listed in the next section.  Drink plenty of fluids, especially water. Fluids can help remove uric acid from your body. Try to drink 8-16 cups (1.9-3.8 L) a day.  Limit foods high in fat, especially saturated fat, as fat makes it harder for the body to get rid of uric acid. Foods high in saturated fat include pizza, cheese, ice cream, whole milk, fried foods, and gravies. Choose foods that are lower in fat and lean sources of protein. Use olive oil when cooking as it contains healthy fats that are not high in saturated fat.  Limit alcohol. Alcohol interferes with the elimination of uric acid from your body. If you are having a gout attack, avoid all alcohol.  Keep in mind that different people's bodies react differently to different foods. You will probably learn over time which foods do or do not affect you. If you discover that a food tends to cause your gout to flare up, avoid eating that food. You can more freely enjoy foods that do not cause problems. If you have any questions about a food item, talk to your dietitian or health care provider. Which foods are low, moderate, and high in purines? The following is a list of foods that are low, moderate, and high in  purines. You can eat any amount of the foods that are low in purines. You may be able to have small amounts of foods that are moderate in purines. Ask your health care provider how much of a food moderate in purines you can have. Avoid foods high in purines. Grains  Foods low in purines: Enriched white bread, pasta, rice, cake, cornbread, popcorn.  Foods moderate in purines: Whole-grain breads and cereals, wheat germ, bran, oatmeal. Uncooked oatmeal. Dry wheat bran or wheat germ.  Foods high in purines: Pancakes, [place] toast, biscuits, muffins. Vegetables  Foods low in purines: All vegetables, except those that are moderate in purines.  Foods moderate in purines: Asparagus, cauliflower, spinach, mushrooms, green peas. Fruits  All fruits are low in purines. Meats and other Protein Foods  Foods low in purines: Eggs, nuts, peanut butter.  Foods moderate in purines: 80-90% lean beef, lamb, veal, pork, poultry, fish, eggs, peanut butter, nuts. Crab, lobster, oysters, and shrimp. Cooked dried beans, peas, and lentils.  Foods high in purines: Anchovies, sardines, herring, mussels, tuna, codfish, scallops, trout, and haddock. Berniece Salines. Organ meats (such as liver or kidney). Tripe. Game meat. Goose. Sweetbreads. Dairy  All dairy foods are low in purines. Low-fat and fat-free dairy products are best because they are low in saturated fat. Beverages  Drinks low in purines: Water, carbonated beverages, tea, coffee, cocoa.  Drinks moderate in purines:  Soft drinks and other drinks sweetened with high-fructose corn syrup. Juices. To find whether a food or drink is sweetened with high-fructose corn syrup, look at the ingredients list.  Drinks high in purines: Alcoholic beverages (such as beer). Condiments  Foods low in purines: Salt, herbs, olives, pickles, relishes, vinegar.  Foods moderate in purines: Butter, margarine, oils, mayonnaise. Fats and Oils  Foods low in purines: All types, except  gravies and sauces made with meat.  Foods high in purines: Gravies and sauces made with meat. Other Foods  Foods low in purines: Sugars, sweets, gelatin. Cake. Soups made without meat.  Foods moderate in purines: Meat-based or fish-based soups, broths, or bouillons. Foods and drinks sweetened with high-fructose corn syrup.  Foods high in purines: High-fat desserts (such as ice cream, cookies, cakes, pies, doughnuts, and chocolate). Contact your dietitian for more information on foods that are not listed here. This information is not intended to replace advice given to you by your health care provider. Make sure you discuss any questions you have with your health care provider. Document Released: 10/27/2010 Document Revised: 12/08/2015 Document Reviewed: 06/08/2013 Elsevier Interactive Patient Education  2017 [employer].  IF you received an x-ray today, you will receive an invoice from [facility]. Please contact [facility] at [phone] with questions or concerns regarding your invoice.   IF you received labwork today, you will receive an invoice from [facility]. Please contact [facility] at [phone] with questions or concerns regarding your invoice.   Our billing staff will not be able to assist you with questions regarding bills from these companies.  You will be contacted with the lab results as soon as they are available. The fastest way to get your results is to activate your My Chart account. Instructions are located on the last page of this paperwork. If you have not heard from [place] regarding the results in 2 weeks, please contact this office.

## 2017-11-15 ENCOUNTER — Encounter: Payer: Self-pay | Admitting: Emergency Medicine

## 2017-11-15 ENCOUNTER — Other Ambulatory Visit: Payer: Self-pay

## 2017-11-15 ENCOUNTER — Ambulatory Visit: Payer: BLUE CROSS/BLUE SHIELD | Admitting: Emergency Medicine

## 2017-11-15 ENCOUNTER — Ambulatory Visit: Payer: BLUE CROSS/BLUE SHIELD | Admitting: Family Medicine

## 2017-11-15 VITALS — BP 98/62 | HR 75 | Temp 98.3°F | Resp 16 | Wt 151.6 lb

## 2017-11-15 DIAGNOSIS — S0502XA Injury of conjunctiva and corneal abrasion without foreign body, left eye, initial encounter: Secondary | ICD-10-CM | POA: Diagnosis not present

## 2017-11-15 DIAGNOSIS — W5503XA Scratched by cat, initial encounter: Secondary | ICD-10-CM

## 2017-11-15 NOTE — Patient Instructions (Addendum)
     IF you received an x-ray today, you will receive an invoice from [facility]. Please contact [facility] at [phone] with questions or concerns regarding your invoice.   IF you received labwork today, you will receive an invoice from [place]. Please contact [facility] at [phone] with questions or concerns regarding your invoice.   Our billing staff will not be able to assist you with questions regarding bills from these companies.  You will be contacted with the lab results as soon as they are available. The fastest way to get your results is to activate your My Chart account. Instructions are located on the last page of this paperwork. If you have not heard from [place] regarding the results in 2 weeks, please contact this office.     Corneal Abrasion A corneal abrasion is a scratch or injury to the clear covering over the front of your eye (cornea). This can be painful. It is important to get treatment for a corneal abrasion. If this problem is not treated, it can affect your eyesight (vision). Follow these instructions at home: Medicines  Use eye drops or ointments as told by your doctor.  If you were prescribed antibiotic drops or ointment, use them as told by your doctor. Do not stop using the antibiotic even if you start to feel better.  Take over-the-counter and prescription medicines only as told by your doctor.  Do not drive or use heavy machinery while taking prescription pain medicine. General instructions  If you have an eye patch, wear it as told by your doctor. ? Do not drive or use machinery while wearing an eye patch. ? Follow instructions from your doctor about when to take off the patch.  Ask your doctor if you can use a cold, wet cloth (compress) on your eye to help with pain.  Do not rub or touch your eye. Do not wash out your eye.  Do not wear contact lenses until your doctor says that this is okay.  Avoid bright light.  Avoid  straining your eyes.  Keep all follow-up visits as told by your doctor. Doing this can help to prevent infection and loss of eyesight. Contact a doctor if:  You continue to have eye pain and other symptoms for more than 2 days.  You get new symptoms, such as: ? Redness. ? Watery eyes (tearing). ? Discharge.  You have discharge that makes your eyelids stick together in the morning.  Symptoms come back after your eye heals. Get help right away if:  You have very bad eye pain that does not get better with medicine.  You lose eyesight. Summary  A corneal abrasion is a scratch or injury to the clear covering over the front of your eye (cornea).  It is important to get treatment for a corneal abrasion. If this problem is not treated, it can affect your eyesight (vision).  Use eye drops or ointments as told by your doctor.  If you have an eye patch, do not drive or use machinery while wearing it. This information is not intended to replace advice given to you by your health care provider. Make sure you discuss any questions you have with your health care provider. Document Released: 12/19/2007 Document Revised: 06/16/2016 Document Reviewed: 06/16/2016 Elsevier Interactive Patient Education  2017 [employer].

## 2017-11-15 NOTE — Progress Notes (Signed)
Ruth Perez 52 y.o.   Chief Complaint  Patient presents with  . Animal Bite    per patient her cat scratched her under the LEFT eye last night    HISTORY OF PRESENT ILLNESS: This is a 52 y.o. female sustained cat scratch to left eye last night.  Feels like something in the eye.  Little better than last night today.  HPI   Prior to Admission medications   Medication Sig Start Date End Date Taking? Authorizing Provider  lisinopril-hydrochlorothiazide (PRINZIDE,ZESTORETIC) 10-12.5 MG tablet TAKE 1 TABLET BY MOUTH ONCE DAILY 09/26/17  Yes English, Stephanie D, PA  LORazepam (ATIVAN) 0.5 MG tablet One to 2 tablets as needed for stress every 8 hours 06/27/17  Yes English, Stephanie D, PA  predniSONE (DELTASONE) 10 MG tablet Take 40 mg daily x 4 days; 30mg  x 4 days; 20mg  x 3 days; 10 mg x 2 days 11/12/17  Yes McVey, Gelene Mink, PA-C  COLCRYS 0.6 MG tablet TAKE 1 TABLET BY MOUTH ONCE DAILY Patient not taking: Reported on 11/15/2017 09/26/17   Ivar Drape D, PA  indomethacin (INDOCIN) 50 MG capsule Take 1 capsule (50 mg total) by mouth 3 (three) times daily with meals. Patient not taking: Reported on 11/15/2017 11/01/17   Rutherford Guys, MD    Allergies  Allergen Reactions  . Codeine Nausea And Vomiting    Patient Active Problem List   Diagnosis Date Noted  . Gout 11/01/2017  . Leukocytosis 02/17/2015  . Diverticulosis 02/17/2015  . Fever 02/17/2015  . SIRS (systemic inflammatory response syndrome) ([place]) 02/17/2015  . Alcohol abuse 02/17/2015  . Acute diverticulitis 02/17/2015  . Pancreatic lesion 02/17/2015  . Colonic diverticular abscess 02/17/2015  . Hematuria 03/01/2014  . Smoker unmotivated to quit 04/20/2013  . Essential hypertension 10/09/2012  . Arthritis of neck 10/09/2012  . EtOH dependence ([place]) 10/09/2012    Past Medical History:  Diagnosis Date  . Alcohol abuse   . Arthritis   . Diverticulitis   . Hyperlipidemia   . Hypertension   .  Pancreatic cyst     Past Surgical History:  Procedure Laterality Date  . MOUTH SURGERY    . TONSILLECTOMY      Social History   Socioeconomic History  . Marital status: Single    Spouse name: Not on file  . Number of children: 0  . Years of education: Not on file  . Highest education level: Not on file  Occupational History  . Not on file  Social Needs  . Financial resource strain: Not on file  . Food insecurity:    Worry: Not on file    Inability: Not on file  . Transportation needs:    Medical: Not on file    Non-medical: Not on file  Tobacco Use  . Smoking status: Former Smoker    Packs/day: 1.00    Years: 35.00    Pack years: 35.00    Types: Cigarettes  . Smokeless tobacco: Never Used  . Tobacco comment: e cig  Substance and Sexual Activity  . Alcohol use: Yes    Alcohol/week: 29.4 oz    Types: 49 Standard drinks or equivalent per week  . Drug use: Yes    Types: Marijuana  . Sexual activity: Not on file  Lifestyle  . Physical activity:    Days per week: Not on file    Minutes per session: Not on file  . Stress: Not on file  Relationships  . Social connections:  Talks on phone: Not on file    Gets together: Not on file    Attends religious service: Not on file    Active member of club or organization: Not on file    Attends meetings of clubs or organizations: Not on file    Relationship status: Not on file  . Intimate partner violence:    Fear of current or ex partner: Not on file    Emotionally abused: Not on file    Physically abused: Not on file    Forced sexual activity: Not on file  Other Topics Concern  . Not on file  Social History Narrative  . Not on file    Family History  Problem Relation Age of Onset  . Heart disease Mother   . Thyroid cancer Mother   . Kidney disease Father   . Colon polyps Father   . Heart disease Father   . Aneurysm Father        AAA  . Neuropathy Father      Review of Systems  Constitutional:  Negative.  Negative for chills and fever.  Eyes: Negative for blurred vision, double vision, discharge and redness.  Respiratory: Negative.  Negative for shortness of breath.   Cardiovascular: Negative.  Negative for chest pain.  Gastrointestinal: Negative for nausea and vomiting.  Skin: Negative.   Neurological: Negative.  Negative for dizziness and headaches.  Endo/Heme/Allergies: Negative.   All other systems reviewed and are negative.   Vitals:   11/15/17 1046  BP: 98/62  Pulse: 75  Resp: 16  Temp: 98.3 F (36.8 C)  SpO2: 97%    Physical Exam  Constitutional: She is oriented to person, place, and time. She appears well-developed and well-nourished.  HENT:  Head: Normocephalic and atraumatic.  Eyes: Pupils are equal, round, and reactive to light. Conjunctivae and EOM are normal.  Neck: Normal range of motion. Neck supple.  Cardiovascular: Normal rate and regular rhythm.  Pulmonary/Chest: Effort normal.  Musculoskeletal: Normal range of motion.  Neurological: She is alert and oriented to person, place, and time.  Skin: Skin is warm and dry. Capillary refill takes less than 2 seconds.  Psychiatric: She has a normal mood and affect. Her behavior is normal.  Vitals reviewed.    ASSESSMENT & PLAN: Procedure note: Tetracaine applied to left eye and pain gone.  Follow with fluoroscein and normal saline solution.  Corneal abrasion at 5:00 seen.  No foreign body seen.  Layer of tobramycin ophthalmic ointment applied. Ruth Perez was seen today for animal bite.  Diagnoses and all orders for this visit:  Abrasion of left cornea, initial encounter  Cat scratch    Patient Instructions       IF you received an x-ray today, you will receive an invoice from [facility]. Please contact [facility] at [phone] with questions or concerns regarding your invoice.   IF you received labwork today, you will receive an invoice from [facility]. Please contact  [facility] at [phone] with questions or concerns regarding your invoice.   Our billing staff will not be able to assist you with questions regarding bills from these companies.  You will be contacted with the lab results as soon as they are available. The fastest way to get your results is to activate your My Chart account. Instructions are located on the last page of this paperwork. If you have not heard from [place] regarding the results in 2 weeks, please contact this office.     Corneal Abrasion A corneal  abrasion is a scratch or injury to the clear covering over the front of your eye (cornea). This can be painful. It is important to get treatment for a corneal abrasion. If this problem is not treated, it can affect your eyesight (vision). Follow these instructions at home: Medicines  Use eye drops or ointments as told by your doctor.  If you were prescribed antibiotic drops or ointment, use them as told by your doctor. Do not stop using the antibiotic even if you start to feel better.  Take over-the-counter and prescription medicines only as told by your doctor.  Do not drive or use heavy machinery while taking prescription pain medicine. General instructions  If you have an eye patch, wear it as told by your doctor. ? Do not drive or use machinery while wearing an eye patch. ? Follow instructions from your doctor about when to take off the patch.  Ask your doctor if you can use a cold, wet cloth (compress) on your eye to help with pain.  Do not rub or touch your eye. Do not wash out your eye.  Do not wear contact lenses until your doctor says that this is okay.  Avoid bright light.  Avoid straining your eyes.  Keep all follow-up visits as told by your doctor. Doing this can help to prevent infection and loss of eyesight. Contact a doctor if:  You continue to have eye pain and other symptoms for more than 2 days.  You get new symptoms, such as: ? Redness. ? Watery eyes  (tearing). ? Discharge.  You have discharge that makes your eyelids stick together in the morning.  Symptoms come back after your eye heals. Get help right away if:  You have very bad eye pain that does not get better with medicine.  You lose eyesight. Summary  A corneal abrasion is a scratch or injury to the clear covering over the front of your eye (cornea).  It is important to get treatment for a corneal abrasion. If this problem is not treated, it can affect your eyesight (vision).  Use eye drops or ointments as told by your doctor.  If you have an eye patch, do not drive or use machinery while wearing it. This information is not intended to replace advice given to you by your health care provider. Make sure you discuss any questions you have with your health care provider. Document Released: 12/19/2007 Document Revised: 06/16/2016 Document Reviewed: 06/16/2016 Elsevier Interactive Patient Education  2017 Elsevier Inc.     Agustina Caroli, MD [facility]

## 2017-11-20 ENCOUNTER — Ambulatory Visit (INDEPENDENT_AMBULATORY_CARE_PROVIDER_SITE_OTHER): Payer: BLUE CROSS/BLUE SHIELD

## 2017-11-20 ENCOUNTER — Ambulatory Visit: Payer: BLUE CROSS/BLUE SHIELD | Admitting: Physician Assistant

## 2017-11-20 ENCOUNTER — Encounter: Payer: Self-pay | Admitting: Physician Assistant

## 2017-11-20 VITALS — BP 100/58 | HR 98 | Temp 98.3°F | Ht 61.0 in | Wt 153.2 lb

## 2017-11-20 DIAGNOSIS — M109 Gout, unspecified: Secondary | ICD-10-CM

## 2017-11-20 DIAGNOSIS — M79671 Pain in right foot: Secondary | ICD-10-CM

## 2017-11-20 DIAGNOSIS — F411 Generalized anxiety disorder: Secondary | ICD-10-CM | POA: Diagnosis not present

## 2017-11-20 MED ORDER — PREDNISONE 10 MG PO TABS: ORAL_TABLET | ORAL | 0 refills | Status: DC

## 2017-11-20 MED ORDER — METHYLPREDNISOLONE ACETATE 80 MG/ML IJ SUSP: 80.0000 mg | Freq: Once | INTRAMUSCULAR | Status: DC

## 2017-11-20 MED ORDER — KETOROLAC TROMETHAMINE 60 MG/2ML IM SOLN
60.0000 mg | Freq: Once | INTRAMUSCULAR | Status: AC
  Administered 2017-11-20: 60 mg via INTRAMUSCULAR

## 2017-11-20 MED ORDER — LORAZEPAM 0.5 MG PO TABS: ORAL_TABLET | ORAL | 0 refills | Status: DC

## 2017-11-20 NOTE — Patient Instructions (Addendum)
Tomorrow, take 4 of your 10mg  prednisone pills (40mg ) for the next 4 days.  Then take 30mg  (3 pills) x 3 days, then 2 pills (20mg ) x 3 days, then 10mg  x 3 days  You will receive a phone call to schedule an appointment with sports medicine for an injection in your toe (if you need it).

## 2017-11-20 NOTE — Progress Notes (Signed)
Ruth Perez  MRN: [id number] DOB: 1965-11-13  PCP: Joretta Bachelor, PA  Subjective:  Pt is a 52 year old female who presents to clinic for f/u gout. This is her third OV for this problem.  4/30 she was rx Prednisone taper x 10 days.  4/19 rx for indomethacin.   Since her last OV, her elbow and right ankle have completely improved. She still c/o pain of the joint of her right big toe. Pain is worse when sheet is touching it. Last night toe was red, warm and swollen. She is still taking the prednisone taper from her last OV.   Review of Systems  Constitutional: Negative for chills, diaphoresis, fatigue and fever.  Musculoskeletal: Positive for arthralgias and gait problem.    Patient Active Problem List   Diagnosis Date Noted  . Left corneal abrasion 11/15/2017  . Cat scratch 11/15/2017  . Gout 11/01/2017  . Leukocytosis 02/17/2015  . Diverticulosis 02/17/2015  . Fever 02/17/2015  . SIRS (systemic inflammatory response syndrome) ([place]) 02/17/2015  . Alcohol abuse 02/17/2015  . Acute diverticulitis 02/17/2015  . Pancreatic lesion 02/17/2015  . Colonic diverticular abscess 02/17/2015  . Hematuria 03/01/2014  . Smoker unmotivated to quit 04/20/2013  . Essential hypertension 10/09/2012  . Arthritis of neck 10/09/2012  . EtOH dependence ([place]) 10/09/2012    Current Outpatient Medications on File Prior to Visit  Medication Sig Dispense Refill  . lisinopril-hydrochlorothiazide (PRINZIDE,ZESTORETIC) 10-12.5 MG tablet TAKE 1 TABLET BY MOUTH ONCE DAILY 90 tablet 1  . LORazepam (ATIVAN) 0.5 MG tablet One to 2 tablets as needed for stress every 8 hours 30 tablet 0  . predniSONE (DELTASONE) 10 MG tablet Take 40 mg daily x 4 days; 30mg  x 4 days; 20mg  x 3 days; 10 mg x 2 days 36 tablet 0   No current facility-administered medications on file prior to visit.     Allergies  Allergen Reactions  . Codeine Nausea And Vomiting  . Indomethacin Other (See Comments)    Per pt  causes her to feel dizzy     Objective:  BP (!) 100/58 (BP Location: Left Arm)   Pulse 98   Temp 98.3 F (36.8 C) (Oral)   Ht 5\' 1"  (1.549 m)   Wt 153 lb 3.2 oz (69.5 kg)   LMP 02/18/2013   SpO2 97%   BMI 28.95 kg/m   Physical Exam  Constitutional: She is oriented to person, place, and time. No distress.  Musculoskeletal:       Right foot: There is bony tenderness (MTP joint).  No erythema, edema, or warmth.   Neurological: She is alert and oriented to person, place, and time.  Skin: Skin is warm and dry.  Psychiatric: Judgment normal.  Vitals reviewed.   Dg Toe Great Right  Result Date: 11/20/2017 CLINICAL DATA:  Two weeks of metatarsophalangeal joint pain. Possible gout. EXAM: RIGHT GREAT TOE COMPARISON:  Right foot series of August 10, 2016 FINDINGS: There is subjective mild soft tissue swelling over the first MTP joint. The articular surfaces are smooth. The joint space is well maintained there are no erosive or significant proliferative changes. No abnormal soft tissue calcifications are demonstrated. The first tarsometatarsal joint and the interphalangeal joint are unremarkable. IMPRESSION: No objective evidence of acute gouty arthritis of the first MTP joint. No significant degenerative changes either. Mild soft tissue swelling could reflect early gout however. Electronically Signed   By: David  Martinique M.D.   On: 11/20/2017 14:18    Assessment  and Plan :  1. Gout involving toe of right foot, unspecified cause, unspecified chronicity 2. Foot pain, right - DG Toe Great Right; Future - Ambulatory referral to Sports Medicine - ketorolac (TORADOL) injection 60 mg - predniSONE (DELTASONE) 10 MG tablet; Take 30mg  x 3 days; 20mg  x 3 days; 10 mg x 2 days  Dispense: 17 tablet; Refill: 0 - pt presents for nonresolving case of gout. Injection of toradol provided by CMA. Plan to extend 40mg  prednisone x 4 more days, then taper down. X-ray is negative. Plan to refer to sports medicine  in the case she is still not improving and needs intraarticular injection for gout.  3. Anxiety state - LORazepam (ATIVAN) 0.5 MG tablet; One to 2 tablets as needed for stress every 8 hours  Dispense: 30 tablet; Refill: 0   Mercer Pod, PA-C  [facility] [place] 11/20/2017 1:52 PM

## 2017-11-22 ENCOUNTER — Telehealth: Payer: Self-pay | Admitting: Physician Assistant

## 2017-11-22 NOTE — Telephone Encounter (Signed)
Pt advised on correct dosage.

## 2017-11-22 NOTE — Telephone Encounter (Signed)
Copied from [place] [id number]. Topic: Quick Communication - Rx Refill/Question >> Nov 22, 2017  8:54 AM Margot Ables wrote: Pt calling because AVS 11/20/17 states to take prednisone 4 for 4 days, 3 for 3 days, 2 for 3 days, and 1 for 3 days. RX is for #17 pills and states 3 for 3 days, 2 for 3 days, and 1 for 2 days. Pt is asking which is the correct way. She started taking it today and took 3 pills this morning. Please call to advise.

## 2017-12-03 ENCOUNTER — Ambulatory Visit (INDEPENDENT_AMBULATORY_CARE_PROVIDER_SITE_OTHER): Payer: BLUE CROSS/BLUE SHIELD | Admitting: Family Medicine

## 2017-12-03 ENCOUNTER — Other Ambulatory Visit: Payer: Self-pay

## 2017-12-03 ENCOUNTER — Encounter: Payer: Self-pay | Admitting: Family Medicine

## 2017-12-03 VITALS — BP 112/64 | HR 95 | Temp 98.1°F | Ht 61.5 in | Wt 154.8 lb

## 2017-12-03 DIAGNOSIS — M109 Gout, unspecified: Secondary | ICD-10-CM

## 2017-12-03 MED ORDER — PREDNISONE 10 MG PO TABS: ORAL_TABLET | ORAL | 0 refills | Status: DC

## 2017-12-03 MED ORDER — ALLOPURINOL 100 MG PO TABS: 100.0000 mg | ORAL_TABLET | Freq: Every day | ORAL | 6 refills | Status: DC

## 2017-12-03 NOTE — Patient Instructions (Signed)
     IF you received an x-ray today, you will receive an invoice from [facility]. Please contact [facility] at [phone] with questions or concerns regarding your invoice.   IF you received labwork today, you will receive an invoice from [facility]. Please contact [facility] at [phone] with questions or concerns regarding your invoice.   Our billing staff will not be able to assist you with questions regarding bills from these companies.  You will be contacted with the lab results as soon as they are available. The fastest way to get your results is to activate your My Chart account. Instructions are located on the last page of this paperwork. If you have not heard from us regarding the results in 2 weeks, please contact this office.     

## 2017-12-03 NOTE — Progress Notes (Signed)
5/21/20194:46 PM  Ruth Perez 06-Apr-1966, 52 y.o. female [id number]  Chief Complaint  Patient presents with  . Follow-up    gout follow up, still having some pain however, has gotten alot better with 2 tx of prednisone.     HPI:   Patient is a 52 y.o. female with past medical history significant for gout amd HTN who presents today for followup Last seen on 5/8. Today is her 5th visit She was prescribed oral pred twice, last rx given on the 8th for persistent sx of the Right big toe She states that she is doing much better after second pred tx She is on hctz for BP She has no acute concerns today  Fall Risk  12/03/2017 11/20/2017 11/15/2017 11/12/2017 11/01/2017  Falls in the past year? No No No No No     Depression screen [facility] 2/9 12/03/2017 11/20/2017 11/15/2017  Decreased Interest 0 0 0  Down, Depressed, Hopeless 0 0 0  PHQ - 2 Score 0 0 0    Allergies  Allergen Reactions  . Codeine Nausea And Vomiting  . Indomethacin Other (See Comments)    Per pt causes her to feel dizzy    Prior to Admission medications   Medication Sig Start Date End Date Taking? Authorizing Provider  lisinopril-hydrochlorothiazide (PRINZIDE,ZESTORETIC) 10-12.5 MG tablet TAKE 1 TABLET BY MOUTH ONCE DAILY 09/26/17  Yes English, Stephanie D, PA  predniSONE (DELTASONE) 10 MG tablet Take 30mg  x 3 days; 20mg  x 3 days; 10 mg x 2 days 11/20/17  Yes McVey, Gelene Mink, PA-C  LORazepam (ATIVAN) 0.5 MG tablet One to 2 tablets as needed for stress every 8 hours Patient not taking: Reported on 12/03/2017 11/20/17   McVey, Gelene Mink, PA-C    Past Medical History:  Diagnosis Date  . Alcohol abuse   . Arthritis   . Diverticulitis   . Hyperlipidemia   . Hypertension   . Pancreatic cyst     Past Surgical History:  Procedure Laterality Date  . MOUTH SURGERY    . TONSILLECTOMY      Social History   Tobacco Use  . Smoking status: Former Smoker    Packs/day: 1.00    Years: 35.00    Pack years:  35.00    Types: Cigarettes  . Smokeless tobacco: Never Used  . Tobacco comment: e cig  Substance Use Topics  . Alcohol use: Yes    Alcohol/week: 29.4 oz    Types: 49 Standard drinks or equivalent per week    Family History  Problem Relation Age of Onset  . Heart disease Mother   . Thyroid cancer Mother   . Kidney disease Father   . Colon polyps Father   . Heart disease Father   . Aneurysm Father        AAA  . Neuropathy Father     ROS Per hpi  OBJECTIVE:  Blood pressure 112/64, pulse 95, temperature 98.1 F (36.7 C), temperature source Oral, height 5' 1.5" (1.562 m), weight 154 lb 12.8 oz (70.2 kg), last menstrual period 02/18/2013, SpO2 98 %.  Physical Exam  Gen: AAOx3, NAD MSK: right nig toe MTP joint mildly swollen, red and TTP . ROM intact    ASSESSMENT and PLAN  1. Gout involving toe of right foot, unspecified cause, unspecified chronicity Discussed importance of dietary modifications in gout prevention. Discussed possibility of changing hctz. Discussing adding allopurinol. Providing another rx for pred in case starting allopurinol causes another flare. New meds r/se/b - Uric  Acid - Basic metabolic panel - predniSONE (DELTASONE) 10 MG tablet; Take 30mg  x 3 days; 20mg  x 3 days; 10 mg x 2 days  Other orders - allopurinol (ZYLOPRIM) 100 MG tablet; Take 1 tablet (100 mg total) by mouth daily.  Return in about 1 month (around 12/31/2017).    Rutherford Guys, MD [facility] at [place], [place] [address] Ph.  [phone] Fax [phone]

## 2017-12-04 LAB — BASIC METABOLIC PANEL
BUN/Creatinine Ratio: 23 (ref 9–23)
BUN: 20 mg/dL (ref 6–24)
CO2: 21 mmol/L (ref 20–29)
Calcium: 9.6 mg/dL (ref 8.7–10.2)
Chloride: 97 mmol/L (ref 96–106)
Creatinine, Ser: 0.87 mg/dL (ref 0.57–1.00)
GFR calc Af Amer: 88 mL/min/{1.73_m2} (ref >59)
GFR calc non Af Amer: 76 mL/min/{1.73_m2} (ref >59)
Glucose: 82 mg/dL (ref 65–99)
Potassium: 3.9 mmol/L (ref 3.5–5.2)
Sodium: 139 mmol/L (ref 134–144)

## 2017-12-04 LAB — URIC ACID: Uric Acid: 9 mg/dL — ABNORMAL HIGH (ref 2.5–7.1)

## 2017-12-16 ENCOUNTER — Encounter: Payer: Self-pay | Admitting: *Deleted

## 2017-12-22 ENCOUNTER — Encounter: Payer: Self-pay | Admitting: Family Medicine

## 2017-12-27 ENCOUNTER — Ambulatory Visit: Payer: BLUE CROSS/BLUE SHIELD | Admitting: Gastroenterology

## 2018-01-02 ENCOUNTER — Ambulatory Visit: Payer: BLUE CROSS/BLUE SHIELD | Admitting: Family Medicine

## 2018-01-07 ENCOUNTER — Other Ambulatory Visit: Payer: Self-pay

## 2018-01-07 ENCOUNTER — Encounter: Payer: Self-pay | Admitting: Family Medicine

## 2018-01-07 ENCOUNTER — Ambulatory Visit (INDEPENDENT_AMBULATORY_CARE_PROVIDER_SITE_OTHER): Payer: BLUE CROSS/BLUE SHIELD | Admitting: Family Medicine

## 2018-01-07 VITALS — BP 108/62 | HR 83 | Temp 98.8°F | Ht 61.42 in | Wt 157.0 lb

## 2018-01-07 DIAGNOSIS — M109 Gout, unspecified: Secondary | ICD-10-CM

## 2018-01-07 DIAGNOSIS — I10 Essential (primary) hypertension: Secondary | ICD-10-CM

## 2018-01-07 DIAGNOSIS — Z1231 Encounter for screening mammogram for malignant neoplasm of breast: Secondary | ICD-10-CM

## 2018-01-07 MED ORDER — LISINOPRIL 20 MG PO TABS: 20.0000 mg | ORAL_TABLET | Freq: Every day | ORAL | 1 refills | Status: DC

## 2018-01-07 MED ORDER — ALLOPURINOL 300 MG PO TABS: 300.0000 mg | ORAL_TABLET | Freq: Every day | ORAL | 1 refills | Status: DC

## 2018-01-07 NOTE — Patient Instructions (Signed)
     IF you received an x-ray today, you will receive an invoice from [facility]. Please contact [facility] at [phone] with questions or concerns regarding your invoice.   IF you received labwork today, you will receive an invoice from [facility]. Please contact [facility] at [phone] with questions or concerns regarding your invoice.   Our billing staff will not be able to assist you with questions regarding bills from these companies.  You will be contacted with the lab results as soon as they are available. The fastest way to get your results is to activate your My Chart account. Instructions are located on the last page of this paperwork. If you have not heard from us regarding the results in 2 weeks, please contact this office.     

## 2018-01-07 NOTE — Progress Notes (Signed)
6/25/20194:07 PM  Ruth Perez [birth year], 51 y.o. female [id number]  Chief Complaint  Patient presents with  . Follow-up    gout, still having some flares. other than that pain is much better. Allopurinol did cause some flares with the gout, subsided after a couple of days    HPI:   Patient is a 52 y.o. female with past medical history significant for HTN and gout who presents today for followup  Started allopurinol, had 2 days of increased joint pain which resolved without sign intervention, and now tolerating well She continues to work on diet and etoh She has added tart cherry extract daily Overall doing much better Denies any joint swelling, redness or warmth Patient due for mammogram She has no acute concerns today  Fall Risk  01/07/2018 12/03/2017 11/20/2017 11/15/2017 11/12/2017  Falls in the past year? No No No No No     Depression screen [facility] 2/9 01/07/2018 12/03/2017 11/20/2017  Decreased Interest 0 0 0  Down, Depressed, Hopeless 0 0 0  PHQ - 2 Score 0 0 0    Allergies  Allergen Reactions  . Codeine Nausea And Vomiting  . Indomethacin Other (See Comments)    Per pt causes her to feel dizzy    Prior to Admission medications   Medication Sig Start Date End Date Taking? Authorizing Provider  allopurinol (ZYLOPRIM) 100 MG tablet Take 1 tablet (100 mg total) by mouth daily. 12/03/17  Yes Rutherford Guys, MD  lisinopril-hydrochlorothiazide (PRINZIDE,ZESTORETIC) 10-12.5 MG tablet TAKE 1 TABLET BY MOUTH ONCE DAILY 09/26/17  Yes Joretta Bachelor, PA    Past Medical History:  Diagnosis Date  . Alcohol abuse   . Arthritis   . Diverticulitis   . Gout   . Hyperlipidemia   . Hypertension   . Pancreatic cyst     Past Surgical History:  Procedure Laterality Date  . MOUTH SURGERY    . TONSILLECTOMY      Social History   Tobacco Use  . Smoking status: Former Smoker    Packs/day: 1.00    Years: 35.00    Pack years: 35.00    Types: Cigarettes  . Smokeless  tobacco: Never Used  . Tobacco comment: e cig  Substance Use Topics  . Alcohol use: Yes    Alcohol/week: 29.4 oz    Types: 49 Standard drinks or equivalent per week    Family History  Problem Relation Age of Onset  . Heart disease Mother   . Thyroid cancer Mother   . Kidney disease Father   . Colon polyps Father   . Heart disease Father   . Aneurysm Father        AAA  . Neuropathy Father     ROS Per hpi  OBJECTIVE:  Blood pressure 108/62, pulse 83, temperature 98.8 F (37.1 C), temperature source Oral, height 5' 1.42" (1.56 m), weight 157 lb (71.2 kg), last menstrual period 02/18/2013, SpO2 98 %.  Physical Exam  Constitutional: She is oriented to person, place, and time. She appears well-developed and well-nourished.  HENT:  Head: Normocephalic and atraumatic.  Mouth/Throat: Mucous membranes are normal.  Eyes: Pupils are equal, round, and reactive to light. EOM are normal. No scleral icterus.  Neck: Neck supple.  Pulmonary/Chest: Effort normal.  Musculoskeletal:  big right toe normal  Neurological: She is alert and oriented to person, place, and time.  Skin: Skin is warm and dry.  Nursing note and vitals reviewed.   ASSESSMENT and PLAN  1. Gout  involving toe of right foot, unspecified cause, unspecified chronicity 2. Essential hypertension Maximizing allopurinol. Colchicine or pred for flareups. Dc hctz in order to minimize potential triggers. Cont to work on dietary triggers, incl etoh.  - Uric Acid  3. Visit for screening mammogram - MM Digital Screening; Future  Other orders - allopurinol (ZYLOPRIM) 300 MG tablet; Take 1 tablet (300 mg total) by mouth daily. - lisinopril (PRINIVIL,ZESTRIL) 20 MG tablet; Take 1 tablet (20 mg total) by mouth daily.  Return in about 3 months (around 04/09/2018).    Rutherford Guys, MD Primary Care at [place], [place] [address] Ph.  [phone] Fax [phone]

## 2018-01-08 ENCOUNTER — Encounter: Payer: Self-pay | Admitting: Gastroenterology

## 2018-01-08 ENCOUNTER — Ambulatory Visit (INDEPENDENT_AMBULATORY_CARE_PROVIDER_SITE_OTHER): Payer: BLUE CROSS/BLUE SHIELD | Admitting: Gastroenterology

## 2018-01-08 VITALS — BP 108/60 | HR 88 | Ht 60.0 in | Wt 158.4 lb

## 2018-01-08 DIAGNOSIS — Z86010 Personal history of colonic polyps: Secondary | ICD-10-CM | POA: Diagnosis not present

## 2018-01-08 LAB — URIC ACID: Uric Acid: 6.6 mg/dL (ref 2.5–7.1)

## 2018-01-08 NOTE — Progress Notes (Signed)
01/08/2018 Ruth Perez [id number] 05/17/1966   HISTORY OF PRESENT ILLNESS: This is a 52 year old female who is known to Dr. Ardis Hughs for colonoscopy in December 2015.  At that time she was found to have several polyps that were removed, one was 11 mm and another was 10 mm.  Some were tubular adenomas.  Repeat colonoscopy was recommended in 3 years from that time.  She is here today to schedule another colonoscopy.  She tells me that her BM's are always inconsistent.  Usually has several BM's per day.  Depends on what she eats, but sometimes food "goes right through me".  Random colon biopsies were normal, negative for microscopic colitis.  Dr. Ardis Hughs recommended Imodium, but she does not take that.  She says that she "just deals with it".    Past Medical History:  Diagnosis Date  . Adenomatous colon polyp    tubular  . Alcohol abuse   . Arthritis   . Diverticulitis   . Gout   . Hyperlipidemia   . Hypertension   . Pancreatic cyst    Past Surgical History:  Procedure Laterality Date  . MOUTH SURGERY    . TONSILLECTOMY      reports that she has quit smoking. Her smoking use included cigarettes. She has a 35.00 pack-year smoking history. She has never used smokeless tobacco. She reports that she drinks about 29.4 oz of alcohol per week. She reports that she has current or past drug history. Drug: Marijuana. family history includes Aneurysm in her father; Colon polyps in her father; Heart disease in her father and mother; Kidney disease in her father; Neuropathy in her father; Thyroid cancer in her mother. Allergies  Allergen Reactions  . Codeine Nausea And Vomiting  . Indomethacin Other (See Comments)    Per pt causes her to feel dizzy      Outpatient Encounter Medications as of 01/08/2018  Medication Sig  . allopurinol (ZYLOPRIM) 300 MG tablet Take 1 tablet (300 mg total) by mouth daily.  Marland Kitchen lisinopril (PRINIVIL,ZESTRIL) 20 MG tablet Take 1 tablet (20 mg total) by mouth  daily.  . [DISCONTINUED] predniSONE (DELTASONE) 10 MG tablet Take 30mg  x 3 days; 20mg  x 3 days; 10 mg x 2 days (Patient not taking: Reported on 01/07/2018)   No facility-administered encounter medications on file as of 01/08/2018.      REVIEW OF SYSTEMS  : All other systems reviewed and negative except where noted in the History of Present Illness.   PHYSICAL EXAM: BP 108/60 (BP Location: Left Arm, Patient Position: Sitting, Cuff Size: Normal)   Pulse 88   Ht 5' (1.524 m)   Wt 158 lb 6 oz (71.8 kg)   LMP 02/18/2013   BMI 30.93 kg/m  General: Well developed white female in no acute distress Head: Normocephalic and atraumatic Eyes:  Sclerae anicteric, conjunctiva pink. Ears: Normal auditory acuity  Lungs: Clear throughout to auscultation; no increased WOB. Heart: Regular rate and rhythm; no M/R/G. Abdomen: Soft, non-distended.  BS present.  Non-tender. Rectal:  Will be done at the time of colonoscopy. Musculoskeletal: Symmetrical with no gross deformities  Skin: No lesions on visible extremities Extremities: No edema  Neurological: Alert oriented x 4, grossly non-focal Psychological:  Alert and cooperative. Normal mood and affect  ASSESSMENT AND PLAN: *Personal history of colon polyps:  Several tubular adenomas removed in 06/2014, some were larger polyps.  Repeat recommended in 3 years.  Will schedule for colonoscopy with Dr. Ardis Hughs. *Loose stools:  Recommended she try a daily powder fiber supplement such as Benefiber or Citrucel to try to bulk her stools.  **The risks, benefits, and alternatives to colonoscopy were discussed with the patient and she consents to proceed.    CC:  Rutherford Guys, MD

## 2018-01-08 NOTE — Patient Instructions (Signed)
It has been recommended to you by your physician that you have a colonoscopy completed. Per your request, we did not schedule the procedure today. Please contact our office at [phone] should you decide to have the procedure completed.     

## 2018-01-09 ENCOUNTER — Encounter: Payer: Self-pay | Admitting: Radiology

## 2018-01-09 NOTE — Progress Notes (Signed)
I agree with the above note, plan 

## 2018-01-25 ENCOUNTER — Encounter: Payer: Self-pay | Admitting: Urgent Care

## 2018-01-25 ENCOUNTER — Ambulatory Visit: Payer: BLUE CROSS/BLUE SHIELD | Admitting: Urgent Care

## 2018-01-25 VITALS — BP 107/72 | HR 84 | Temp 98.8°F | Resp 17 | Ht 60.5 in | Wt 156.0 lb

## 2018-01-25 DIAGNOSIS — H02846 Edema of left eye, unspecified eyelid: Secondary | ICD-10-CM | POA: Diagnosis not present

## 2018-01-25 DIAGNOSIS — H5712 Ocular pain, left eye: Secondary | ICD-10-CM | POA: Diagnosis not present

## 2018-01-25 DIAGNOSIS — W5503XA Scratched by cat, initial encounter: Secondary | ICD-10-CM | POA: Diagnosis not present

## 2018-01-25 DIAGNOSIS — S0502XA Injury of conjunctiva and corneal abrasion without foreign body, left eye, initial encounter: Secondary | ICD-10-CM | POA: Diagnosis not present

## 2018-01-25 DIAGNOSIS — S0502XD Injury of conjunctiva and corneal abrasion without foreign body, left eye, subsequent encounter: Secondary | ICD-10-CM

## 2018-01-25 MED ORDER — TOBRAMYCIN 0.3 % OP SOLN: 2.0000 [drp] | OPHTHALMIC | 0 refills | Status: DC

## 2018-01-25 NOTE — Patient Instructions (Addendum)
Corneal Abrasion A corneal abrasion is a scratch or injury to the clear covering over the front of your eye (cornea). Your cornea forms a clear dome that protects your eye and helps to focus your vision. Your cornea is made up of many layers. The surface layer is a single layer of cells (corneal epithelium). It is one of the most sensitive tissues in your body. A corneal abrasion can be very painful. If a corneal abrasion is not treated, it can become infected and cause an ulcer. This can lead to scarring. A scarred cornea can affect your vision. Sometimes abrasions come back in the same area, even after the original injury has healed (recurrent erosion syndrome). What are the causes? This condition may be caused by:  A poke in the eye.  A gritty or irritating substance (foreign body) in the eye.  Excessive eye rubbing.  Very dry eyes.  Certain eye infections.  Contact lenses that fit poorly or are worn for a long period of time. You can also injure your cornea when putting contacts lenses in your eye or taking them out.  Eye surgery.  Sometimes, the cause is unknown. What are the signs or symptoms? Symptoms of this condition include:  Eye pain. The pain may get worse when your eye is open or when you move your eye.  A feeling of something stuck in your eye.  Having trouble keeping your eye open, or not being able to keep it open.  Tearing and redness.  Sensitivity to light.  Blurred vision.  Headache.  How is this diagnosed? This condition may be diagnosed based on:  Your medical history.  Your symptoms.  An eye exam. You may work with a health care provider who specializes in diseases and conditions of the eye (ophthalmologist). Before the eye exam, numbing drops may be put into your eye. You may also have dye put in your eye with a dropper or a small paper strip. The dye makes the abrasion easy to see when your ophthalmologist examines your eye with a light. Your  ophthalmologist may look at your eye through an eye scope (slit lamp).  How is this treated? Treatment may vary depending on the cause of your condition, and it may include:  Washing out your eye.  Removing any foreign body.  Antibiotic drops or ointment to treat an infection.  Steroid drops or ointment to treat redness, irritation, or inflammation.  Pain medicine.  An eye patch to keep your eye closed.  Follow these instructions at home: Medicines  Use eye drops or ointments as told by your eye care provider.  If you were prescribed antibiotic drops or ointment, use them as told by your eye care provider. Do not stop using the antibiotic even if you start to feel better.  Take over-the-counter and prescription medicines only as told by your eye care provider.  Do not drive or use heavy machinery while taking prescription pain medicine. General instructions  If you have an eye patch, wear it as told by your eye care provider. ? Do not drive or use machinery while wearing an eye patch. Your ability to judge distances will be impaired. ? Follow instructions from your eye care provider about when to remove the patch.  Ask your eye care provider whether you can use a cold, wet cloth (compress) on your eye to relieve pain.  Do not rub or touch your eye. Do not wash out your eye.  Do not wear contact lenses until  your eye care provider says that this is okay.  Avoid bright light and eye strain.  Keep all follow-up visits as told by your eye care provider. This is important for preventing infection and vision loss. Contact a health care provider if:  You continue to have eye pain and other symptoms for more than 2 days.  You develop new symptoms, such as redness, tearing, or discharge.  You have discharge that makes your eyelids stick together in the morning.  Your eye patch becomes so loose that you can blink your eye.  Symptoms return after the original abrasion has  healed. Get help right away if:  You have severe eye pain that does not get better with medicine.  You have vision loss. Summary  A corneal abrasion is a scratch on the outer layer of the clear covering over the front of your eye (cornea).  Corneal abrasion can cause eye pain, redness, tearing, and blurred vision.  This condition is usually treated with medicine to prevent infection and scarring. You also may have to wear an eye patch to cover your eye.  Let your eye care provider know if your symptoms continue for more than 2 days. This information is not intended to replace advice given to you by your health care provider. Make sure you discuss any questions you have with your health care provider. Document Released: 06/29/2000 Document Revised: 06/12/2016 Document Reviewed: 06/12/2016 Elsevier Interactive Patient Education  2018 [employer].     IF you received an x-ray today, you will receive an invoice from [facility]. Please contact [facility] at [phone] with questions or concerns regarding your invoice.   IF you received labwork today, you will receive an invoice from [facility]. Please contact [facility] at [phone] with questions or concerns regarding your invoice.   Our billing staff will not be able to assist you with questions regarding bills from these companies.  You will be contacted with the lab results as soon as they are available. The fastest way to get your results is to activate your My Chart account. Instructions are located on the last page of this paperwork. If you have not heard from [place] regarding the results in 2 weeks, please contact this office.

## 2018-01-25 NOTE — Progress Notes (Signed)
   MRN: [id number] DOB: 03/21/1966  Subjective:   Ruth Perez is a 52 y.o. female presenting for 1 week history of recurrent left eye pain. Patient was seen in early May 2019 for corneal abrasion following a cat scratch. Patient was given tobramycin ointment in clinic. She did not use it at home. Reports that there was resolution up until the past week. Started to have left eye pain again, left upper eyelid swelling, watering of her eye, worse in the past 2 days. Denies photophobia, pain with eye movement, eye trauma, foreign body sensation. She is traveling out of town for 5 days on Monday-Friday.   Ruth Perez has a current medication list which includes the following prescription(s): allopurinol and lisinopril. Also is allergic to codeine and indomethacin.  Ruth Perez  has a past medical history of Adenomatous colon polyp, Alcohol abuse, Arthritis, Diverticulitis, Gout, Hyperlipidemia, Hypertension, and Pancreatic cyst. Also  has a past surgical history that includes Tonsillectomy and Mouth surgery.  Objective:   Vitals: BP 107/72   Pulse 84   Temp 98.8 F (37.1 C) (Oral)   Resp 17   Ht 5' 0.5" (1.537 m)   Wt 156 lb (70.8 kg)   LMP 02/18/2013   SpO2 98%   BMI 29.97 kg/m    Visual Acuity Screening   Right eye Left eye Both eyes  Without correction: 20/20 20/20 20/20   With correction:      Physical Exam  Constitutional: She is oriented to person, place, and time. She appears well-developed and well-nourished.  Eyes: Pupils are equal, round, and reactive to light. EOM are normal. Lids are everted and swept, no foreign bodies found. Right eye exhibits no chemosis, no discharge, no exudate and no hordeolum. No foreign body present in the right eye. Left eye exhibits no chemosis, no discharge, no exudate and no hordeolum. No foreign body present in the left eye. Right conjunctiva is not injected. Right conjunctiva has no hemorrhage. Left conjunctiva is not injected. Left conjunctiva has  no hemorrhage. No scleral icterus.    Cardiovascular: Normal rate.  Pulmonary/Chest: Effort normal.  Neurological: She is alert and oriented to person, place, and time.   Eye Exam: Eyelids everted and swept for foreign body. The eye was anesthetized with 2 drops of proparacaine and stained with fluorescein. Examination under woods lamp does reveal a foreign body or area of increased stain uptake as seen on previous exam. No ulcerations, dendritic lesions noted. The eye was then irrigated copiously with saline.   Assessment and Plan :   Cat scratch  Abrasion of left cornea, subsequent encounter  Eye pain, left  Swelling of eyelid, left  Will have patient start tobramycin. She declined azithromycin for cat scratch disease. ER and RTC precautions reviewed. Counseled that we may need to pursue urgent eye consult if she remains symptomatic. She will consider this but is traveling on Monday-Friday so may not end up following up.   Jaynee Eagles, PA-C [facility] [place] [phone] 01/25/2018  10:56 AM

## 2018-01-28 ENCOUNTER — Ambulatory Visit: Payer: Self-pay | Admitting: Family Medicine

## 2018-01-28 NOTE — Telephone Encounter (Signed)
Pt message re: gout sent to Dr. Pamella Pert

## 2018-01-28 NOTE — Telephone Encounter (Signed)
She is to continue allopurinol thru her gout flare up. She can definitely start prednisone. She can add colcrys if needed.  thanks

## 2018-01-28 NOTE — Telephone Encounter (Signed)
  Reason for Disposition . Caller has URGENT medication question about med that PCP prescribed and triager unable to answer question  Answer Assessment - Initial Assessment Questions 1. 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   My gout in my big right toe is flaring up.   I am on Allopurinol.   Should I stop taking it and start the prednisone that Dr. Pamella Pert gave me?   I think I will go ahead and start the prednisone.   My toe really hurts.   I also have some colcrys, an old Rx with me that I may take if it's ok with Dr. Pamella Pert.  2. SEVERITY: If symptoms are present, ask "Are they mild, moderate or severe?"     I can be reached at [phone] and a detailed message can be left.  I will route this is Dr. Pamella Pert  Protocols used: MEDICATION QUESTION CALL-A-AH

## 2018-02-24 ENCOUNTER — Encounter: Payer: Self-pay | Admitting: Family Medicine

## 2018-02-24 ENCOUNTER — Ambulatory Visit: Payer: BLUE CROSS/BLUE SHIELD | Admitting: Family Medicine

## 2018-02-24 VITALS — BP 112/62 | HR 84 | Temp 98.8°F | Resp 16 | Ht 60.0 in | Wt 159.4 lb

## 2018-02-24 DIAGNOSIS — M109 Gout, unspecified: Secondary | ICD-10-CM

## 2018-02-24 MED ORDER — NAPROXEN 500 MG PO TABS: 500.0000 mg | ORAL_TABLET | Freq: Two times a day (BID) | ORAL | 3 refills | Status: AC

## 2018-02-24 MED ORDER — COLCHICINE 0.6 MG PO TABS: ORAL_TABLET | ORAL | 3 refills | Status: AC

## 2018-02-24 MED ORDER — PREDNISONE 10 MG PO TABS: ORAL_TABLET | ORAL | 0 refills | Status: AC

## 2018-02-24 NOTE — Progress Notes (Signed)
8/12/20198:16 AM  Ruth Perez 1966-07-01, 52 y.o. female [id number]  Chief Complaint  Patient presents with  . Foot Pain    right  . Elbow Pain    left    HPI:   Patient is a 52 y.o. female with past medical history significant for gout who presents today for flare up  Right big toe, usual site Started 3 days ago Started with ibuprofen, not getting better, so started colchicine yesterday Better today  Last flare up was about a month ago, had to take prednisone Continues to struggle with dietary modifications Continues to drink etoh On allopurinol, last uric acid 6.6 xrays done in may, no underlying OA  Requesting refills for all meds related to gout  Fall Risk  01/07/2018 12/03/2017 11/20/2017 11/15/2017 11/12/2017  Falls in the past year? No No No No No     Depression screen [facility] 2/9 02/24/2018 01/07/2018 12/03/2017  Decreased Interest 0 0 0  Down, Depressed, Hopeless 0 0 0  PHQ - 2 Score 0 0 0    Allergies  Allergen Reactions  . Codeine Nausea And Vomiting  . Indomethacin Other (See Comments)    Per pt causes her to feel dizzy    Prior to Admission medications   Medication Sig Start Date End Date Taking? Authorizing Provider  allopurinol (ZYLOPRIM) 300 MG tablet Take 1 tablet (300 mg total) by mouth daily. 01/07/18  Yes Rutherford Guys, MD  colchicine 0.6 MG tablet Take 0.6 mg by mouth daily.   Yes [provider]  lisinopril (PRINIVIL,ZESTRIL) 20 MG tablet Take 1 tablet (20 mg total) by mouth daily. 01/07/18  Yes Rutherford Guys, MD  tobramycin (TOBREX) 0.3 % ophthalmic solution Place 2 drops into the left eye every 4 (four) hours. Patient not taking: Reported on 02/24/2018 01/25/18   Jaynee Eagles, PA-C    Past Medical History:  Diagnosis Date  . Adenomatous colon polyp    tubular  . Alcohol abuse   . Arthritis   . Diverticulitis   . Gout   . Hyperlipidemia   . Hypertension   . Pancreatic cyst     Past Surgical History:  Procedure  Laterality Date  . MOUTH SURGERY    . TONSILLECTOMY      Social History   Tobacco Use  . Smoking status: Former Smoker    Packs/day: 1.00    Years: 35.00    Pack years: 35.00    Types: Cigarettes  . Smokeless tobacco: Never Used  . Tobacco comment: e cig  Substance Use Topics  . Alcohol use: Yes    Alcohol/week: 49.0 standard drinks    Types: 49 Standard drinks or equivalent per week    Family History  Problem Relation Age of Onset  . Heart disease Mother   . Thyroid cancer Mother   . Kidney disease Father   . Colon polyps Father   . Heart disease Father   . Aneurysm Father        AAA  . Neuropathy Father     ROS Per hpi  OBJECTIVE:  Blood pressure 112/62, pulse 84, temperature 98.8 F (37.1 C), temperature source Oral, resp. rate 16, height 5' (1.524 m), weight 159 lb 6.4 oz (72.3 kg), last menstrual period 02/18/2013, SpO2 (!) 84 %. Body mass index is 31.13 kg/m.   Physical Exam  Constitutional: She is oriented to person, place, and time. She appears well-developed and well-nourished.  HENT:  Head: Normocephalic and atraumatic.  Mouth/Throat: Mucous membranes  are normal.  Eyes: Pupils are equal, round, and reactive to light. EOM are normal. No scleral icterus.  Neck: Neck supple.  Pulmonary/Chest: Effort normal.  Musculoskeletal:  1st MTP of right foot, swollen, TTP, no erythema, no warmth  Neurological: She is alert and oriented to person, place, and time.  Skin: Skin is warm and dry.  Nursing note and vitals reviewed.    ASSESSMENT and PLAN  1. Acute gout involving toe of right foot, unspecified cause Resolving, continue with colchicine at this time. No need for prednisone. meds refilled, reviewed r/se/b. Reviewed when to take. Encouraged continue work on dietary modifications  Other orders - colchicine 0.6 MG tablet; Start at onset of gout flare up, take 2 tabs PO followed by 1 tab one hour later, then take 1 tab PO daily until flare up  resolved - naproxen (NAPROSYN) 500 MG tablet; Take 1 tablet (500 mg total) by mouth 2 (two) times daily with a meal. - predniSONE (DELTASONE) 10 MG tablet; If not responding to naproxen or colchicine, start prednisone. 30mg  (3 tabs) PO once a day, until flare resolves and then decrease to 20mg  (2 tabs) PO once a day x 3 days, 10 mg (1 tab) PO once a day x 3 days, 5mg  (1/2 tab) PO once a day x 2 days  Return if symptoms worsen or fail to improve.    Rutherford Guys, MD [facility] at [place], [place] [address] Ph.  [phone] Fax [phone]

## 2018-02-24 NOTE — Patient Instructions (Addendum)
   IF you received an x-ray today, you will receive an invoice from [facility]. Please contact [facility] at [phone] with questions or concerns regarding your invoice.   IF you received labwork today, you will receive an invoice from [facility]. Please contact [facility] at [phone] with questions or concerns regarding your invoice.   Our billing staff will not be able to assist you with questions regarding bills from these companies.  You will be contacted with the lab results as soon as they are available. The fastest way to get your results is to activate your My Chart account. Instructions are located on the last page of this paperwork. If you have not heard from us regarding the results in 2 weeks, please contact this office.      Gout Gout is painful swelling that can happen in some of your joints. Gout is a type of arthritis. This condition is caused by having too much uric acid in your body. Uric acid is a chemical that is made when your body breaks down substances called purines. If your body has too much uric acid, sharp crystals can form and build up in your joints. This causes pain and swelling. Gout attacks can happen quickly and be very painful (acute gout). Over time, the attacks can affect more joints and happen more often (chronic gout). Follow these instructions at home: During a Gout Attack  If directed, put ice on the painful area: ? Put ice in a plastic bag. ? Place a towel between your skin and the bag. ? Leave the ice on for 20 minutes, 2-3 times a day.  Rest the joint as much as possible. If the joint is in your leg, you may be given crutches to use.  Raise (elevate) the painful joint above the level of your heart as often as you can.  Drink enough fluids to keep your pee (urine) clear or pale yellow.  Take over-the-counter and prescription medicines only as told by your doctor.  Do not drive or use heavy machinery while taking  prescription pain medicine.  Follow instructions from your doctor about what you can or cannot eat and drink.  Return to your normal activities as told by your doctor. Ask your doctor what activities are safe for you. Avoiding Future Gout Attacks  Follow a low-purine diet as told by a specialist (dietitian) or your doctor. Avoid foods and drinks that have a lot of purines, such as: ? Liver. ? Kidney. ? Anchovies. ? Asparagus. ? Herring. ? Mushrooms ? Mussels. ? Beer.  Limit alcohol intake to no more than 1 drink a day for nonpregnant women and 2 drinks a day for men. One drink equals 12 oz of beer, 5 oz of wine, or 1 oz of hard liquor.  Stay at a healthy weight or lose weight if you are overweight. If you want to lose weight, talk with your doctor. It is important that you do not lose weight too fast.  Start or continue an exercise plan as told by your doctor.  Drink enough fluids to keep your pee clear or pale yellow.  Take over-the-counter and prescription medicines only as told by your doctor.  Keep all follow-up visits as told by your doctor. This is important. Contact a doctor if:  You have another gout attack.  You still have symptoms of a gout attack after10 days of treatment.  You have problems (side effects) because of your medicines.  You have chills or a fever.    You have burning pain when you pee (urinate).  You have pain in your lower back or belly. Get help right away if:  You have very bad pain.  Your pain cannot be controlled.  You cannot pee. This information is not intended to replace advice given to you by your health care provider. Make sure you discuss any questions you have with your health care provider. Document Released: 04/10/2008 Document Revised: 12/08/2015 Document Reviewed: 04/14/2015 Elsevier Interactive Patient Education  2018 [employer]  

## 2018-03-03 ENCOUNTER — Telehealth: Payer: Self-pay | Admitting: Family Medicine

## 2018-03-03 NOTE — Telephone Encounter (Signed)
Med denial : Colcrys 0.6MG  OR TABS  Papers sent to [facility] box 03/03/18

## 2018-03-04 ENCOUNTER — Encounter: Payer: Self-pay | Admitting: Gastroenterology

## 2018-03-06 ENCOUNTER — Telehealth: Payer: Self-pay | Admitting: Family Medicine

## 2018-03-06 NOTE — Telephone Encounter (Signed)
Patient was denied their medication (Colcrys 0.6MG  tablets) by their insurance company. Denial paper is in the providers box.

## 2018-03-31 ENCOUNTER — Telehealth: Payer: Self-pay | Admitting: Family Medicine

## 2018-03-31 NOTE — Telephone Encounter (Signed)
Left a VM in regards to her appt with Dr. Pamella Pert on 05/01/2018. The provider will be out of the office on that day and would need to be rescheduled.

## 2018-04-25 ENCOUNTER — Ambulatory Visit (AMBULATORY_SURGERY_CENTER): Payer: Self-pay | Admitting: *Deleted

## 2018-04-25 ENCOUNTER — Other Ambulatory Visit: Payer: Self-pay

## 2018-04-25 VITALS — Ht 60.0 in | Wt 159.2 lb

## 2018-04-25 DIAGNOSIS — Z86010 Personal history of colonic polyps: Secondary | ICD-10-CM

## 2018-04-25 MED ORDER — PEG 3350-KCL-NA BICARB-NACL 420 G PO SOLR: 4000.0000 mL | Freq: Once | ORAL | 0 refills | Status: AC

## 2018-04-25 NOTE — Progress Notes (Signed)
Patient denies any allergies to egg or soy products. Patient denies complications with anesthesia/sedation.  Patient denies oxygen use at home and denies diet medications. Pamphlet given on colonoscopy. 

## 2018-04-28 ENCOUNTER — Encounter: Payer: Self-pay | Admitting: Gastroenterology

## 2018-05-01 ENCOUNTER — Ambulatory Visit: Payer: BLUE CROSS/BLUE SHIELD | Admitting: Family Medicine

## 2018-05-09 ENCOUNTER — Encounter: Payer: Self-pay | Admitting: Gastroenterology

## 2018-05-09 ENCOUNTER — Ambulatory Visit (AMBULATORY_SURGERY_CENTER): Payer: BLUE CROSS/BLUE SHIELD | Admitting: Gastroenterology

## 2018-05-09 VITALS — BP 109/90 | HR 70 | Temp 98.6°F | Resp 18 | Ht 60.0 in | Wt 159.0 lb

## 2018-05-09 DIAGNOSIS — K621 Rectal polyp: Secondary | ICD-10-CM | POA: Diagnosis not present

## 2018-05-09 DIAGNOSIS — K635 Polyp of colon: Secondary | ICD-10-CM

## 2018-05-09 DIAGNOSIS — D127 Benign neoplasm of rectosigmoid junction: Secondary | ICD-10-CM | POA: Diagnosis not present

## 2018-05-09 DIAGNOSIS — D125 Benign neoplasm of sigmoid colon: Secondary | ICD-10-CM

## 2018-05-09 DIAGNOSIS — D128 Benign neoplasm of rectum: Secondary | ICD-10-CM

## 2018-05-09 DIAGNOSIS — D129 Benign neoplasm of anus and anal canal: Secondary | ICD-10-CM

## 2018-05-09 DIAGNOSIS — K5730 Diverticulosis of large intestine without perforation or abscess without bleeding: Secondary | ICD-10-CM

## 2018-05-09 DIAGNOSIS — Z86010 Personal history of colonic polyps: Secondary | ICD-10-CM

## 2018-05-09 MED ORDER — SODIUM CHLORIDE 0.9 % IV SOLN: 500.0000 mL | Freq: Once | INTRAVENOUS | Status: DC

## 2018-05-09 NOTE — Op Note (Signed)
[facility] Patient Name: Ruth Perez Procedure Date: 05/09/2018 8:53 AM MRN: [id number] Endoscopist: Milus Banister , MD Age: 52 Referring MD:  Date of Birth: 10/12/1965 Gender: Female Account #: [id number] Procedure:                Colonoscopy Indications:              High risk colon cancer surveillance: Personal                            history of colonic polyps; colonoscopy 2015 Dr.                            Ardis Hughs, 4 polyps, three were adenomas (two of which                            were >1cm), change in bowels Medicines:                Monitored Anesthesia Care Procedure:                Pre-Anesthesia Assessment:                           - Prior to the procedure, a History and Physical                            was performed, and patient medications and                            allergies were reviewed. The patient's tolerance of                            previous anesthesia was also reviewed. The risks                            and benefits of the procedure and the sedation                            options and risks were discussed with the patient.                            All questions were answered, and informed consent                            was obtained. Prior Anticoagulants: The patient has                            taken no previous anticoagulant or antiplatelet                            agents. ASA Grade Assessment: II - A patient with                            mild systemic disease. After reviewing the risks  and benefits, the patient was deemed in                            satisfactory condition to undergo the procedure.                           After obtaining informed consent, the colonoscope                            was passed under direct vision. Throughout the                            procedure, the patient's blood pressure, pulse, and                            oxygen saturations were monitored  continuously. The                            Colonoscope was introduced through the anus and                            advanced to the the cecum, identified by                            appendiceal orifice and ileocecal valve. The                            colonoscopy was performed without difficulty. The                            patient tolerated the procedure well. The quality                            of the bowel preparation was good. The ileocecal                            valve, appendiceal orifice, and rectum were                            photographed. Scope In: 8:56:47 AM Scope Out: 9:11:06 AM Scope Withdrawal Time: 0 hours 10 minutes 10 seconds  Total Procedure Duration: 0 hours 14 minutes 19 seconds  Findings:                 Two sessile polyps were found in the rectum and                            sigmoid colon. The polyps were 2 to 3 mm in size.                            These polyps were removed with a cold snare.                            Resection and retrieval were complete.  Multiple small and large-mouthed diverticula were                            found in the left colon.                           The exam was otherwise without abnormality on                            direct and retroflexion views. Complications:            No immediate complications. Estimated blood loss:                            None. Estimated Blood Loss:     Estimated blood loss: none. Impression:               - Two 2 to 3 mm polyps in the rectum and in the                            sigmoid colon, removed with a cold snare. Resected                            and retrieved.                           - Diverticulosis in the left colon.                           - The examination was otherwise normal on direct                            and retroflexion views. Recommendation:           - Patient has a contact number available for                             emergencies. The signs and symptoms of potential                            delayed complications were discussed with the                            patient. Return to normal activities tomorrow.                            Written discharge instructions were provided to the                            patient.                           - Resume previous diet.                           - Continue present medications.  You will receive a letter within 2-3 weeks with the                            pathology results and my final recommendations.                           If the polyp(s) is proven to be 'pre-cancerous' on                            pathology, you will need repeat colonoscopy in 5                            years. Milus Banister, MD 05/09/2018 9:15:10 AM This report has been signed electronically.

## 2018-05-09 NOTE — Patient Instructions (Signed)
Handouts given on polyps and diverticulosis. 2 polyps removed today.   YOU HAD AN ENDOSCOPIC PROCEDURE TODAY AT [facility]:   Refer to the procedure report that was given to you for any specific questions about what was found during the examination.  If the procedure report does not answer your questions, please call your gastroenterologist to clarify.  If you requested that your care partner not be given the details of your procedure findings, then the procedure report has been included in a sealed envelope for you to review at your convenience later.  YOU SHOULD EXPECT: Some feelings of bloating in the abdomen. Passage of more gas than usual.  Walking can help get rid of the air that was put into your GI tract during the procedure and reduce the bloating. If you had a lower endoscopy (such as a colonoscopy or flexible sigmoidoscopy) you may notice spotting of blood in your stool or on the toilet paper. If you underwent a bowel prep for your procedure, you may not have a normal bowel movement for a few days.  Please Note:  You might notice some irritation and congestion in your nose or some drainage.  This is from the oxygen used during your procedure.  There is no need for concern and it should clear up in a day or so.  SYMPTOMS TO REPORT IMMEDIATELY:   Following lower endoscopy (colonoscopy or flexible sigmoidoscopy):  Excessive amounts of blood in the stool  Significant tenderness or worsening of abdominal pains  Swelling of the abdomen that is new, acute  Fever of 100F or higher   For urgent or emergent issues, a gastroenterologist can be reached at any hour by calling [phone].   DIET:  We do recommend a small meal at first, but then you may proceed to your regular diet.  Drink plenty of fluids but you should avoid alcoholic beverages for 24 hours.  ACTIVITY:  You should plan to take it easy for the rest of today and you should NOT DRIVE or use heavy machinery  until tomorrow (because of the sedation medicines used during the test).    FOLLOW UP: Our staff will call the number listed on your records the next business day following your procedure to check on you and address any questions or concerns that you may have regarding the information given to you following your procedure. If we do not reach you, we will leave a message.  However, if you are feeling well and you are not experiencing any problems, there is no need to return our call.  We will assume that you have returned to your regular daily activities without incident.  If any biopsies were taken you will be contacted by phone or by letter within the next 1-3 weeks.  Please call us at [phone] if you have not heard about the biopsies in 3 weeks.    SIGNATURES/CONFIDENTIALITY: You and/or your care partner have signed paperwork which will be entered into your electronic medical record.  These signatures attest to the fact that that the information above on your After Visit Summary has been reviewed and is understood.  Full responsibility of the confidentiality of this discharge information lies with you and/or your care-partner.

## 2018-05-09 NOTE — Progress Notes (Signed)
Pt's states no medical or surgical changes since previsit or office visit. 

## 2018-05-09 NOTE — Progress Notes (Signed)
Called to room to assist during endoscopic procedure.  Patient ID and intended procedure confirmed with present staff. Received instructions for my participation in the procedure from the performing physician.  

## 2018-05-09 NOTE — Progress Notes (Signed)
A and O x3. Report to RN. Tolerated MAC anesthesia well.

## 2018-05-12 ENCOUNTER — Telehealth: Payer: Self-pay

## 2018-05-12 NOTE — Telephone Encounter (Signed)
  Follow up Call-  Call back number 05/09/2018  Post procedure Call Back phone  # [phone]  Permission to leave phone message Yes  Some recent data might be hidden     Patient questions:  Do you have a fever, pain , or abdominal swelling? No. Pain Score  0 *  Have you tolerated food without any problems? Yes.    Have you been able to return to your normal activities? Yes.    Do you have any questions about your discharge instructions: Diet   No. Medications  No. Follow up visit  No.  Do you have questions or concerns about your Care? No.  Actions: * If pain score is 4 or above: No action needed, pain <4.

## 2018-05-19 ENCOUNTER — Encounter: Payer: Self-pay | Admitting: Gastroenterology

## 2018-05-22 ENCOUNTER — Telehealth: Payer: Self-pay

## 2018-05-22 NOTE — Telephone Encounter (Signed)
The pt is unable to schedule at this time and will call back to set up after thanksgiving.

## 2018-05-22 NOTE — Telephone Encounter (Signed)
-----   Message from Timothy Lasso, RN sent at 05/20/2018  8:33 AM EST -----   ----- Message ----- From: Timothy Lasso, RN Sent: 05/20/2018 To: Timothy Lasso, RN    ----- Message ----- From: Timothy Lasso, RN Sent: 05/16/2018 To: Timothy Lasso, RN    ----- Message ----- From: Timothy Lasso, RN Sent: 05/15/2018 To: Barron Alvine, RN  MRI of pancreas in 2 years

## 2018-06-06 ENCOUNTER — Telehealth: Payer: Self-pay | Admitting: Gastroenterology

## 2018-06-06 DIAGNOSIS — K862 Cyst of pancreas: Secondary | ICD-10-CM

## 2018-06-06 NOTE — Telephone Encounter (Signed)
You have been scheduled for an MRI at [facility] on 06/20/18. Your appointment time is 9 am. Please arrive 30 minutes prior to your appointment time for registration purposes. Please make certain not to have anything to eat or drink 4 hours prior to your test. In addition, if you have any metal in your body, have a pacemaker or defibrillator, please be sure to let your ordering physician know. This test typically takes 45 minutes to 1 hour to complete. Should you need to reschedule, please call [phone] to do so.  The pt will have creat level done prior.  The patient has been notified of this information and all questions answered.  The pt has been advised of the information and verbalized understanding.

## 2018-06-10 ENCOUNTER — Other Ambulatory Visit (INDEPENDENT_AMBULATORY_CARE_PROVIDER_SITE_OTHER): Payer: BLUE CROSS/BLUE SHIELD

## 2018-06-10 DIAGNOSIS — K862 Cyst of pancreas: Secondary | ICD-10-CM

## 2018-06-10 LAB — CREATININE, SERUM: Creatinine, Ser: 0.73 mg/dL (ref 0.40–1.20)

## 2018-06-20 ENCOUNTER — Other Ambulatory Visit: Payer: Self-pay | Admitting: Gastroenterology

## 2018-06-20 ENCOUNTER — Ambulatory Visit (HOSPITAL_COMMUNITY)
Admission: RE | Admit: 2018-06-20 | Discharge: 2018-06-20 | Disposition: A | Discharge status: Home / Self Care | Payer: BLUE CROSS/BLUE SHIELD | Source: Ambulatory Visit | Attending: Gastroenterology | Admitting: Gastroenterology

## 2018-06-20 DIAGNOSIS — I700 Atherosclerosis of aorta: Secondary | ICD-10-CM | POA: Insufficient documentation

## 2018-06-20 DIAGNOSIS — K862 Cyst of pancreas: Secondary | ICD-10-CM | POA: Insufficient documentation

## 2018-06-20 MED ORDER — GADOBUTROL 1 MMOL/ML IV SOLN
7.0000 mL | Freq: Once | INTRAVENOUS | Status: AC | PRN
  Administered 2018-06-20: 7 mL via INTRAVENOUS

## 2018-07-08 ENCOUNTER — Other Ambulatory Visit: Payer: Self-pay | Admitting: Family Medicine

## 2018-07-08 NOTE — Telephone Encounter (Signed)
Requested medication (s) are due for refill today -yes  Requested medication (s) are on the active medication list -yes  Future visit scheduled -no  Last refill: 01/07/18  Notes to clinic: Sent for provider review- failure of lab/BP protocol- no future appointment  Requested Prescriptions  Pending Prescriptions Disp Refills   lisinopril (PRINIVIL,ZESTRIL) 20 MG tablet [Pharmacy Med Name: Lisinopril 20 MG Oral Tablet] 90 tablet 0    Sig: TAKE 1 TABLET BY MOUTH ONCE DAILY     Cardiovascular:  ACE Inhibitors Failed - 07/08/2018  8:29 AM      Failed - K in normal range and within 180 days    Potassium  Date Value Ref Range Status  12/03/2017 3.9 3.5 - 5.2 mmol/L Final         Failed - Last BP in normal range    BP Readings from Last 1 Encounters:  05/09/18 109/90         Passed - Cr in normal range and within 180 days    Creat  Date Value Ref Range Status  12/19/2015 0.57 0.50 - 1.05 mg/dL Final   Creatinine, Ser  Date Value Ref Range Status  06/10/2018 0.73 0.40 - 1.20 mg/dL Final         Passed - Patient is not pregnant      Passed - Valid encounter within last 6 months    Recent Outpatient Visits          4 months ago Acute gout involving toe of right foot, unspecified cause   [facility], Lilia Argue, MD   5 months ago Cat scratch   [facility] at [place], PA-C   6 months ago Gout involving toe of right foot, unspecified cause, unspecified chronicity   [facility], Lilia Argue, MD   7 months ago Gout involving toe of right foot, unspecified cause, unspecified chronicity   [facility], Lilia Argue, MD   7 months ago Gout involving toe of right foot, unspecified cause, unspecified chronicity   Primary Care at [facility], Gelene Mink, [place]           Signed Prescriptions Disp Refills   allopurinol (ZYLOPRIM) 300 MG tablet 90 tablet 0    Sig: TAKE 1 TABLET BY MOUTH ONCE DAILY     Endocrinology:   Gout Agents Passed - 07/08/2018  8:29 AM      Passed - Uric Acid in normal range and within 360 days    Uric Acid  Date Value Ref Range Status  01/07/2018 6.6 2.5 - 7.1 mg/dL Final    Comment:               Therapeutic target for gout patients: <6.0         Passed - Cr in normal range and within 360 days    Creat  Date Value Ref Range Status  12/19/2015 0.57 0.50 - 1.05 mg/dL Final   Creatinine, Ser  Date Value Ref Range Status  06/10/2018 0.73 0.40 - 1.20 mg/dL Final         Passed - Valid encounter within last 12 months    Recent Outpatient Visits          4 months ago Acute gout involving toe of right foot, unspecified cause   [facility], Lilia Argue, MD   5 months ago Cat scratch   [facility] at [place], [place]   6 months  ago Gout involving toe of right foot, unspecified cause, unspecified chronicity   [facility], Lilia Argue, MD   7 months ago Gout involving toe of right foot, unspecified cause, unspecified chronicity   [facility], Lilia Argue, MD   7 months ago Gout involving toe of right foot, unspecified cause, unspecified chronicity   Primary Care at [facility], Gelene Mink, [place]              Requested Prescriptions  Pending Prescriptions Disp Refills   lisinopril (PRINIVIL,ZESTRIL) 20 MG tablet [Pharmacy Med Name: Lisinopril 20 MG Oral Tablet] 90 tablet 0    Sig: TAKE 1 TABLET BY MOUTH ONCE DAILY     Cardiovascular:  ACE Inhibitors Failed - 07/08/2018  8:29 AM      Failed - K in normal range and within 180 days    Potassium  Date Value Ref Range Status  12/03/2017 3.9 3.5 - 5.2 mmol/L Final         Failed - Last BP in normal range    BP Readings from Last 1 Encounters:  05/09/18 109/90         Passed - Cr in normal range and within 180 days    Creat  Date Value Ref Range Status  12/19/2015 0.57 0.50 - 1.05 mg/dL Final   Creatinine, Ser  Date Value Ref Range Status  06/10/2018  0.73 0.40 - 1.20 mg/dL Final         Passed - Patient is not pregnant      Passed - Valid encounter within last 6 months    Recent Outpatient Visits          4 months ago Acute gout involving toe of right foot, unspecified cause   [facility], Lilia Argue, MD   5 months ago Cat scratch   [facility] at [place], PA-C   6 months ago Gout involving toe of right foot, unspecified cause, unspecified chronicity   [facility], Lilia Argue, MD   7 months ago Gout involving toe of right foot, unspecified cause, unspecified chronicity   [facility], Lilia Argue, MD   7 months ago Gout involving toe of right foot, unspecified cause, unspecified chronicity   Primary Care at [facility], Gelene Mink, [place]           Signed Prescriptions Disp Refills   allopurinol (ZYLOPRIM) 300 MG tablet 90 tablet 0    Sig: TAKE 1 TABLET BY MOUTH ONCE DAILY     Endocrinology:  Gout Agents Passed - 07/08/2018  8:29 AM      Passed - Uric Acid in normal range and within 360 days    Uric Acid  Date Value Ref Range Status  01/07/2018 6.6 2.5 - 7.1 mg/dL Final    Comment:               Therapeutic target for gout patients: <6.0         Passed - Cr in normal range and within 360 days    Creat  Date Value Ref Range Status  12/19/2015 0.57 0.50 - 1.05 mg/dL Final   Creatinine, Ser  Date Value Ref Range Status  06/10/2018 0.73 0.40 - 1.20 mg/dL Final         Passed - Valid encounter within last 12 months    Recent Outpatient Visits          4 months ago  Acute gout involving toe of right foot, unspecified cause   [facility], Lilia Argue, MD   5 months ago Cat scratch   Primary Care at [place], [place]   6 months ago Gout involving toe of right foot, unspecified cause, unspecified chronicity   [facility], Lilia Argue, MD   7 months ago Gout involving toe of right foot, unspecified cause, unspecified  chronicity   [facility], Lilia Argue, MD   7 months ago Gout involving toe of right foot, unspecified cause, unspecified chronicity   Primary Care at [facility], Gelene Mink, [place]

## 2018-09-30 ENCOUNTER — Other Ambulatory Visit: Payer: Self-pay | Admitting: Family Medicine

## 2018-09-30 NOTE — Telephone Encounter (Signed)
Requested Prescriptions  Pending Prescriptions Disp Refills  . lisinopril (PRINIVIL,ZESTRIL) 20 MG tablet [Pharmacy Med Name: Lisinopril 20 MG Oral Tablet] 30 tablet 0    Sig: TAKE 1 TABLET BY MOUTH ONCE DAILY(OFFICE VISIT NEEDED FOR REFILLS)     Cardiovascular:  ACE Inhibitors Failed - 09/30/2018  7:47 AM      Failed - K in normal range and within 180 days    Potassium  Date Value Ref Range Status  12/03/2017 3.9 3.5 - 5.2 mmol/L Final         Failed - Last BP in normal range    BP Readings from Last 1 Encounters:  05/09/18 109/90         Failed - Valid encounter within last 6 months    Recent Outpatient Visits          7 months ago Acute gout involving toe of right foot, unspecified cause   [facility], Lilia Argue, MD   8 months ago Cat scratch   [facility] at [place], PA-C   8 months ago Gout involving toe of right foot, unspecified cause, unspecified chronicity   [facility], Lilia Argue, MD   10 months ago Gout involving toe of right foot, unspecified cause, unspecified chronicity   [facility], Lilia Argue, MD   10 months ago Gout involving toe of right foot, unspecified cause, unspecified chronicity   Primary Care at [facility], [place], PA-C             Passed - Cr in normal range and within 180 days    Creat  Date Value Ref Range Status  12/19/2015 0.57 0.50 - 1.05 mg/dL Final   Creatinine, Ser  Date Value Ref Range Status  06/10/2018 0.73 0.40 - 1.20 mg/dL Final         Passed - Patient is not pregnant    Signed Prescriptions Disp Refills   allopurinol (ZYLOPRIM) 300 MG tablet 90 tablet 0    Sig: Take 1 tablet by mouth once daily     Endocrinology:  Gout Agents Passed - 09/30/2018  7:47 AM      Passed - Uric Acid in normal range and within 360 days    Uric Acid  Date Value Ref Range Status  01/07/2018 6.6 2.5 - 7.1 mg/dL Final    Comment:               Therapeutic target for gout  patients: <6.0         Passed - Cr in normal range and within 360 days    Creat  Date Value Ref Range Status  12/19/2015 0.57 0.50 - 1.05 mg/dL Final   Creatinine, Ser  Date Value Ref Range Status  06/10/2018 0.73 0.40 - 1.20 mg/dL Final         Passed - Valid encounter within last 12 months    Recent Outpatient Visits          7 months ago Acute gout involving toe of right foot, unspecified cause   [facility], Lilia Argue, MD   8 months ago Cat scratch   [facility] at [place], PA-C   8 months ago Gout involving toe of right foot, unspecified cause, unspecified chronicity   [facility], Lilia Argue, MD   10 months ago Gout involving toe of right foot, unspecified cause, unspecified chronicity   [facility] Care at  [facility], [place]   10 months ago Gout involving toe of right foot, unspecified cause, unspecified chronicity   Primary Care at [facility], Gelene Mink, [place]

## 2018-09-30 NOTE — Telephone Encounter (Signed)
Requested Prescriptions  Pending Prescriptions Disp Refills  . allopurinol (ZYLOPRIM) 300 MG tablet [Pharmacy Med Name: Allopurinol 300 MG Oral Tablet] 90 tablet 0    Sig: Take 1 tablet by mouth once daily     Endocrinology:  Gout Agents Passed - 09/30/2018  7:47 AM      Passed - Uric Acid in normal range and within 360 days    Uric Acid  Date Value Ref Range Status  01/07/2018 6.6 2.5 - 7.1 mg/dL Final    Comment:               Therapeutic target for gout patients: <6.0         Passed - Cr in normal range and within 360 days    Creat  Date Value Ref Range Status  12/19/2015 0.57 0.50 - 1.05 mg/dL Final   Creatinine, Ser  Date Value Ref Range Status  06/10/2018 0.73 0.40 - 1.20 mg/dL Final         Passed - Valid encounter within last 12 months    Recent Outpatient Visits          7 months ago Acute gout involving toe of right foot, unspecified cause   [facility], Lilia Argue, MD   8 months ago Cat scratch   Primary Care at [place], PA-C   8 months ago Gout involving toe of right foot, unspecified cause, unspecified chronicity   [facility], Lilia Argue, MD   10 months ago Gout involving toe of right foot, unspecified cause, unspecified chronicity   [facility], Lilia Argue, MD   10 months ago Gout involving toe of right foot, unspecified cause, unspecified chronicity   Primary Care at [facility], [place], PA-C           . lisinopril (PRINIVIL,ZESTRIL) 20 MG tablet [Pharmacy Med Name: Lisinopril 20 MG Oral Tablet] 90 tablet 0    Sig: TAKE 1 TABLET BY MOUTH ONCE DAILY(OFFICE VISIT NEEDED FOR REFILLS)     Cardiovascular:  ACE Inhibitors Failed - 09/30/2018  7:47 AM      Failed - K in normal range and within 180 days    Potassium  Date Value Ref Range Status  12/03/2017 3.9 3.5 - 5.2 mmol/L Final         Failed - Last BP in normal range    BP Readings from Last 1 Encounters:  05/09/18 109/90          Failed - Valid encounter within last 6 months    Recent Outpatient Visits          7 months ago Acute gout involving toe of right foot, unspecified cause   [facility], Lilia Argue, MD   8 months ago Cat scratch   Primary Care at [place], PA-C   8 months ago Gout involving toe of right foot, unspecified cause, unspecified chronicity   [facility], Lilia Argue, MD   10 months ago Gout involving toe of right foot, unspecified cause, unspecified chronicity   [facility], Lilia Argue, MD   10 months ago Gout involving toe of right foot, unspecified cause, unspecified chronicity   Primary Care at [facility], [place], PA-C             Passed - Cr in normal range and within 180 days    Creat  Date Value Ref Range Status  12/19/2015 0.57 0.50 - 1.05 mg/dL Final   Creatinine, Ser  Date Value Ref Range Status  06/10/2018 0.73 0.40 - 1.20 mg/dL Final         Passed - Patient is not pregnant

## 2019-09-29 IMAGING — MR MR 3D RECON AT [facility]
19 of 23 series · 19 of 23 positions shown · IV contrast (gadavist)
Comparison: 05/10/2016

CLINICAL DATA: Pancreatic cystic lesion follow up

EXAM:
MRI ABDOMEN WITHOUT AND WITH CONTRAST (INCLUDING MRCP)
TECHNIQUE: Multiplanar multisequence MR imaging of the abdomen was performed
both before and after the administration of intravenous contrast.
Heavily T2-weighted images of the biliary and pancreatic ducts were
obtained, and three-dimensional MRCP images were rendered by post
processing.
CONTRAST:  7 cc Gadavist

[Series 3: T2 fat-sat · axial · 5.0mm · 0.86mm/px · 1 of 54 slices shown]
[im 1/54]
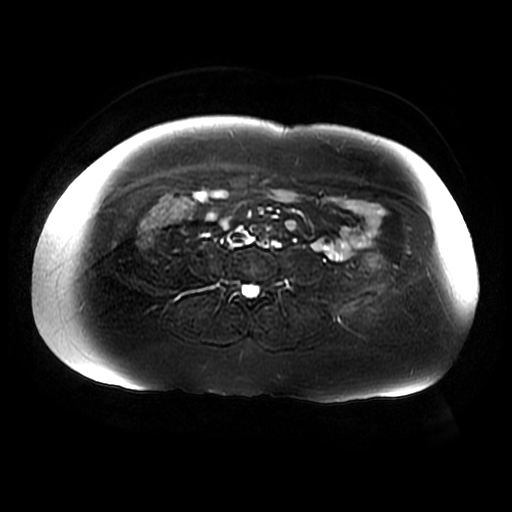

[Series 4: MRCP · coronal · 1.6mm · 0.62mm/px · 1 of 160 slices shown (1 of 2)]
[im 1/160]
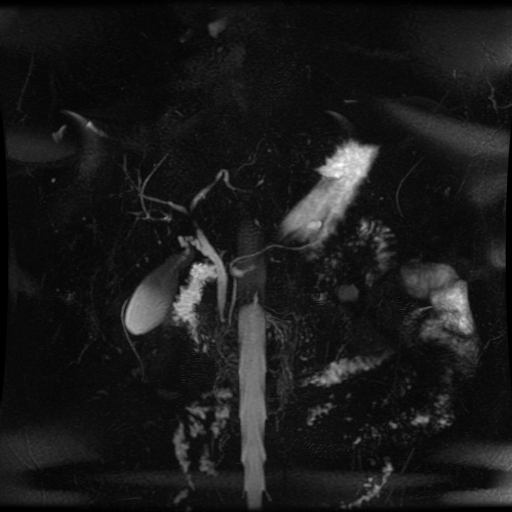

[Series 6: DWI b500 · axial · 6.0mm · 1.76mm/px · 1 of 72 slices shown]
[im 1/72]
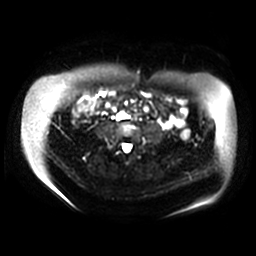

[Series 7: bSSFP fat-sat · coronal · 5.0mm · 0.74mm/px · 1 of 42 slices shown]
[im 1/42]
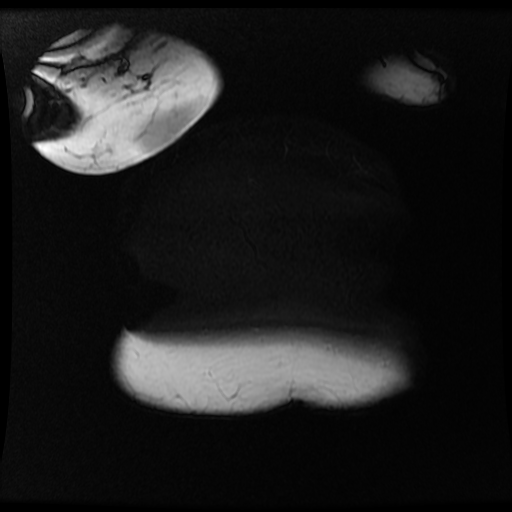

[Series 8: T2 · axial · 5.0mm · 0.86mm/px · 1 of 54 slices shown]
[im 1/54]
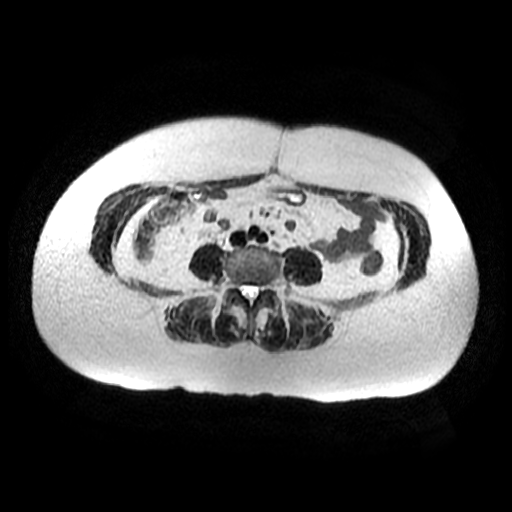

[Series 10: MRCP · coronal · 40.0mm · 0.70mm/px · 1 of 9 slices shown (2 of 2)]
[im 1/9]
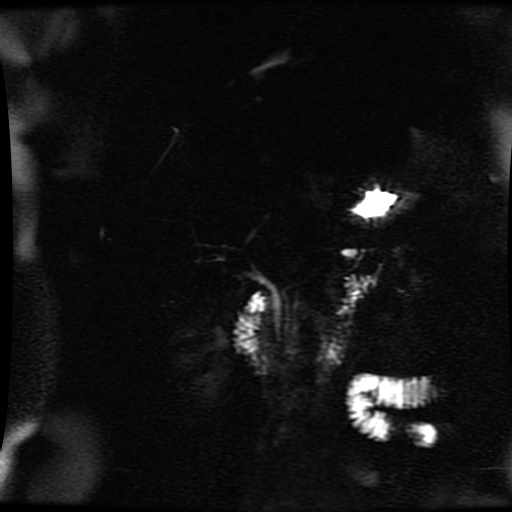

[Series 11: ax dualecho · axial · 5.0mm · 0.86mm/px · 1 of 108 slices shown]
[im 1/108]
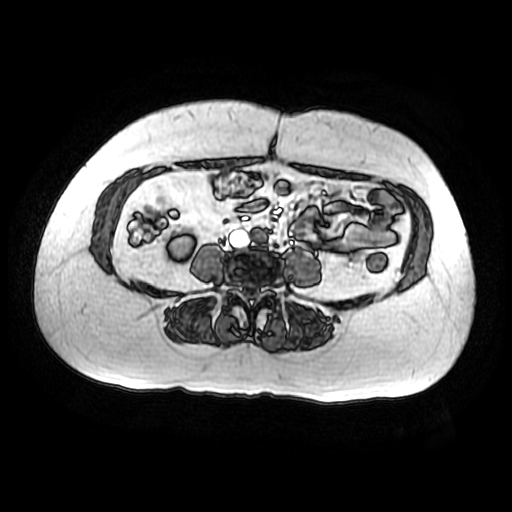

[Series 400: reformatted · axial · 1.6mm · 0.62mm/px · 1 of 105 slices shown (1 of 2)]
[im 1/105]
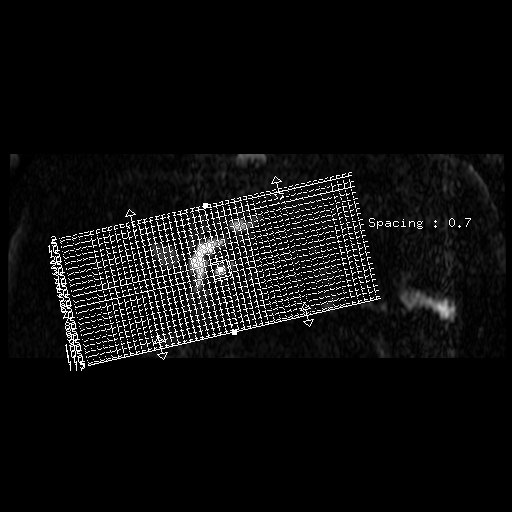

[Series 401: reformatted · coronal · 100.0mm · 0.51mm/px · 1 of 125 slices shown (2 of 2)]
[im 1/125]
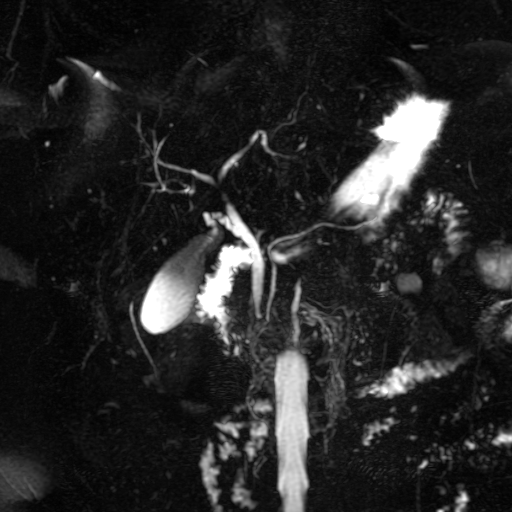

[Series 600: DWI · axial · 6.0mm · 1.76mm/px · 1 of 36 slices shown]
[im 1/36]
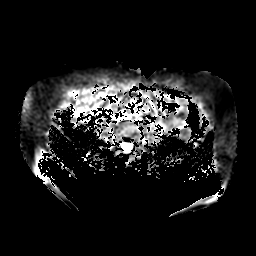

[Series 1200: T1 dynamic · axial · 5.6mm · 0.78mm/px · 1 of 92 slices shown (1 of 5)]
[im 1/92]
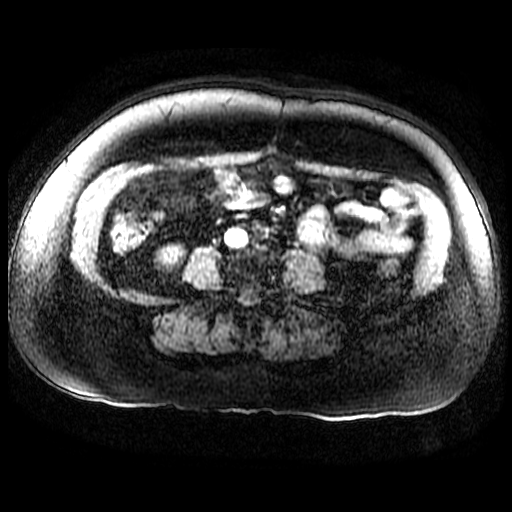

[Series 1201: T1 dynamic · axial · 5.6mm · 0.78mm/px · 1 of 92 slices shown (2 of 5)]
[im 1/92]
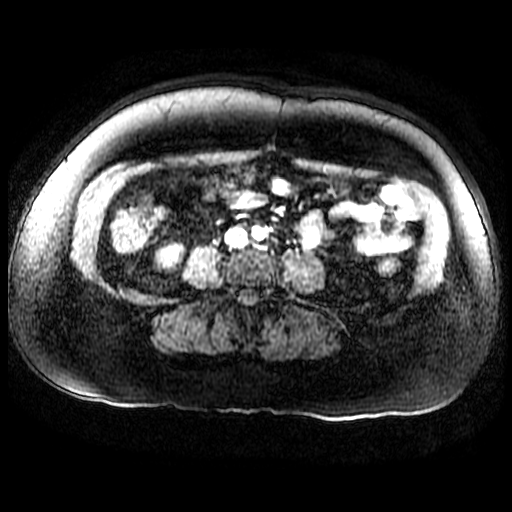

[Series 1203: T1 dynamic · axial · 5.6mm · 0.78mm/px · 1 of 92 slices shown (3 of 5)]
[im 1/92]
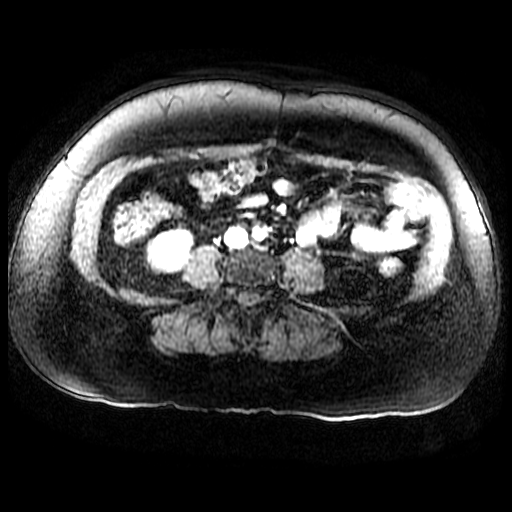

[Series 1204: T1 dynamic · axial · 5.6mm · 0.78mm/px · 1 of 92 slices shown (4 of 5)]
[im 1/92]
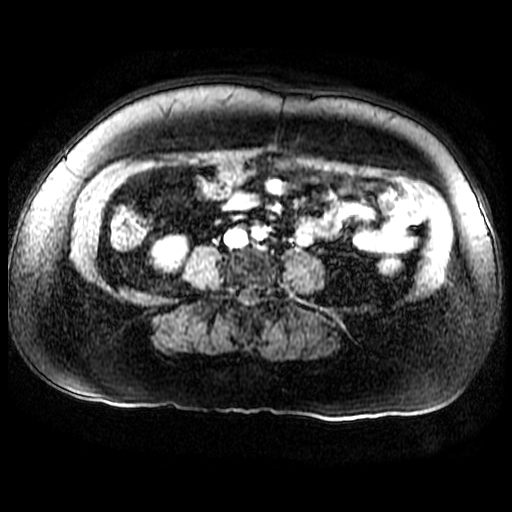

[Series 1205: T1 dynamic · axial · 5.6mm · 0.78mm/px · 1 of 92 slices shown (5 of 5)]
[im 1/92]
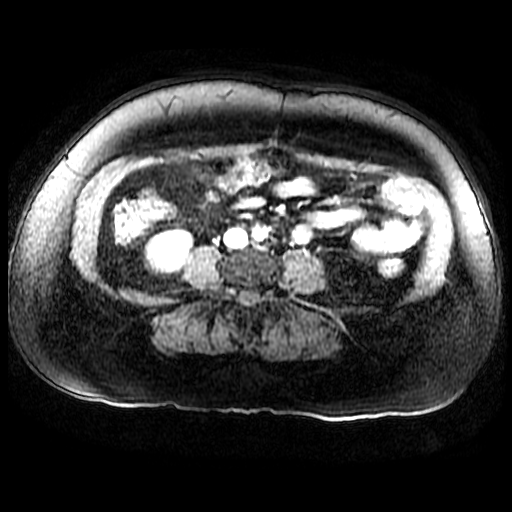

[((id)/(id)/1)-((id)/(id)/1) · axial · 5.6mm · 0.78mm/px · 1 of 92 slices shown (1 of 4)]
[im 1/92]
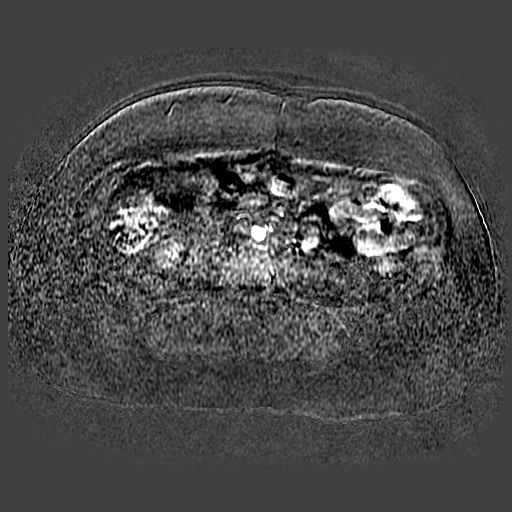

[((id)/(id)/1)-((id)/(id)/1) · axial · 5.6mm · 0.78mm/px · 1 of 92 slices shown (2 of 4)]
[im 1/92]
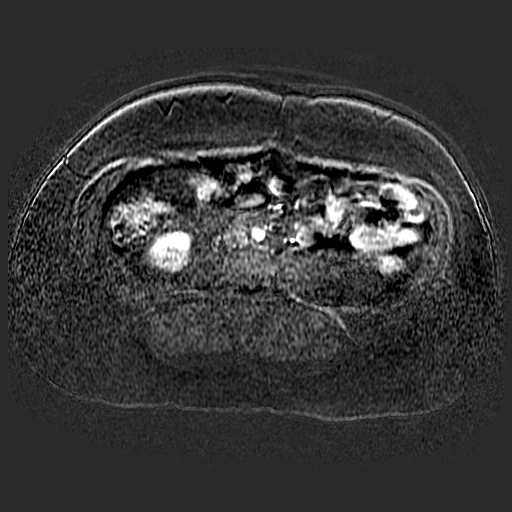

[((id)/(id)/1)-((id)/(id)/1) · axial · 5.6mm · 0.78mm/px · 1 of 92 slices shown (3 of 4)]
[im 1/92]
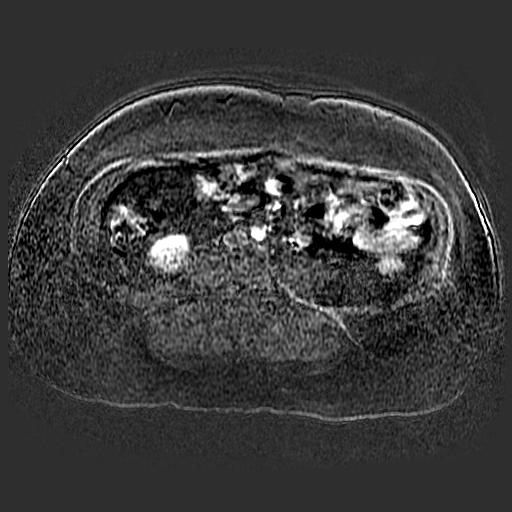

[((id)/(id)/1)-((id)/(id)/1) · axial · 5.6mm · 0.78mm/px · 1 of 92 slices shown (4 of 4)]
[im 1/92]
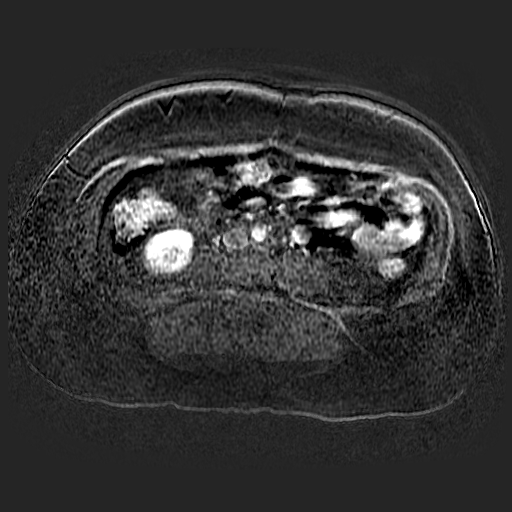

[19 of 23 positions shown; findings below may reference images not displayed]

FINDINGS: Lower chest: Unremarkable

Hepatobiliary: Slightly narrowed appearance of the confluence of the
right and left hepatic ducts common hepatic duct, but unchanged from
4468 without a surrounding mass lesion. This is probably incidental
and there aren't any significant dilated portions of the biliary
tree. No significant abnormal hepatic enhancement.

Pancreas: The cystic lesion in the pancreatic tail does not have
definitive connection to the dorsal pancreatic duct there no
dilatation of the dorsal pancreatic duct. The lesion measures 1.2 by
0.8 by 0.7 cm (volume = 0.4 cm^3) and has no measurable associated
enhancement. Questionable single linear internal septation. By my
measurements this lesion was previously 1.0 by 0.7 by 0.7 cm (volume
= 0.3 cm^3).

No new pancreatic lesion is identified.

Spleen:  Unremarkable

Adrenals/Urinary Tract:  Unremarkable

Stomach/Bowel: Unremarkable

Vascular/Lymphatic: Aortoiliac atherosclerotic vascular disease. No
pathologic adenopathy observed.

Other:  No supplemental non-categorized findings.

Musculoskeletal: Unremarkable
IMPRESSION: 1. The cystic lesion in the pancreatic tail has minimally enlarged
over the past 2 years, but does not qualify technically as "interval
growth." This could be postinflammatory or a tiny intraductal
papillary mucinous neoplasm. Given the current guidelines and
patient's current age, follow up pancreatic protocol MRI in 1 years
time is recommended. This recommendation follows ACR consensus
guidelines: Management of Incidental Pancreatic Cysts: A White Paper
of the ACR Incidental Findings Committee. [HOSPITAL]
2.  Aortic Atherosclerosis ([id number]-H0L.L).

## 2020-07-26 ENCOUNTER — Telehealth: Payer: Self-pay

## 2020-07-26 DIAGNOSIS — K862 Cyst of pancreas: Secondary | ICD-10-CM

## 2020-07-26 NOTE — Telephone Encounter (Signed)
-----   Message from Timothy Lasso, RN sent at 07/18/2020  8:17 AM EST -----  ----- Message ----- From: Timothy Lasso, RN Sent: 07/15/2020  12:00 AM EST To: Timothy Lasso, RN   ----- Message ----- From: Timothy Lasso, RN Sent: 07/13/2020  12:00 AM EST To: Timothy Lasso, RN   ----- Message ----- From: Timothy Lasso, RN Sent: 06/23/2020  12:00 AM EST To: Timothy Lasso, RN  repeat pancreatic MRI in 2 years

## 2020-07-26 NOTE — Telephone Encounter (Signed)
The pt was called and given the appt information.  She tells me that she has transferred to [facility] due to insurance.  Appt has been cancelled.   You have been scheduled for an MRI at [facility] on 08/04/20. Your appointment time is 5 pm. Please arrive 15 minutes prior to your appointment time for registration purposes. Please make certain not to have anything to eat or drink 6 hours prior to your test. In addition, if you have any metal in your body, have a pacemaker or defibrillator, please be sure to let your ordering physician know. This test typically takes 45 minutes to 1 hour to complete. Should you need to reschedule, please call [phone] to do so.

## 2020-08-04 ENCOUNTER — Ambulatory Visit (HOSPITAL_COMMUNITY): Payer: BLUE CROSS/BLUE SHIELD
